# Patient Record
Sex: Female | Born: 1965 | Race: White | Hispanic: No | Marital: Single | Smoking: Never smoker
Health system: Southern US, Community
[De-identification: ages and names within clinical notes are randomized; demographics above are authoritative.]

## PROBLEM LIST (undated history)

## (undated) DIAGNOSIS — E7800 Pure hypercholesterolemia, unspecified: Secondary | ICD-10-CM

## (undated) DIAGNOSIS — M2550 Pain in unspecified joint: Secondary | ICD-10-CM

## (undated) DIAGNOSIS — R5383 Other fatigue: Secondary | ICD-10-CM

## (undated) DIAGNOSIS — T50995A Adverse effect of other drugs, medicaments and biological substances, initial encounter: Secondary | ICD-10-CM

## (undated) DIAGNOSIS — M79643 Pain in unspecified hand: Secondary | ICD-10-CM

## (undated) DIAGNOSIS — L709 Acne, unspecified: Secondary | ICD-10-CM

## (undated) DIAGNOSIS — E785 Hyperlipidemia, unspecified: Secondary | ICD-10-CM

## (undated) DIAGNOSIS — H60339 Swimmer's ear, unspecified ear: Secondary | ICD-10-CM

## (undated) DIAGNOSIS — Z800 Family history of malignant neoplasm of digestive organs: Secondary | ICD-10-CM

## (undated) DIAGNOSIS — N950 Postmenopausal bleeding: Secondary | ICD-10-CM

## (undated) DIAGNOSIS — R7303 Prediabetes: Secondary | ICD-10-CM

## (undated) DIAGNOSIS — R319 Hematuria, unspecified: Secondary | ICD-10-CM

## (undated) DIAGNOSIS — Z86018 Personal history of other benign neoplasm: Secondary | ICD-10-CM

## (undated) DIAGNOSIS — M7060 Trochanteric bursitis, unspecified hip: Secondary | ICD-10-CM

## (undated) DIAGNOSIS — Z8742 Personal history of other diseases of the female genital tract: Secondary | ICD-10-CM

## (undated) DIAGNOSIS — N809 Endometriosis, unspecified: Secondary | ICD-10-CM

## (undated) DIAGNOSIS — Z8249 Family history of ischemic heart disease and other diseases of the circulatory system: Secondary | ICD-10-CM

## (undated) DIAGNOSIS — D509 Iron deficiency anemia, unspecified: Secondary | ICD-10-CM

## (undated) DIAGNOSIS — E781 Pure hyperglyceridemia: Secondary | ICD-10-CM

## (undated) DIAGNOSIS — E119 Type 2 diabetes mellitus without complications: Secondary | ICD-10-CM

## (undated) DIAGNOSIS — E669 Obesity, unspecified: Secondary | ICD-10-CM

## (undated) DIAGNOSIS — D219 Benign neoplasm of connective and other soft tissue, unspecified: Secondary | ICD-10-CM

## (undated) DIAGNOSIS — I10 Essential (primary) hypertension: Secondary | ICD-10-CM

## (undated) DIAGNOSIS — L732 Hidradenitis suppurativa: Secondary | ICD-10-CM

## (undated) DIAGNOSIS — G4726 Circadian rhythm sleep disorder, shift work type: Secondary | ICD-10-CM

## (undated) HISTORY — DX: Pain in unspecified hand: M79.643

## (undated) HISTORY — DX: Prediabetes: R73.03

## (undated) HISTORY — DX: Hidradenitis suppurativa: L73.2

## (undated) HISTORY — DX: Swimmer's ear, unspecified ear: H60.339

## (undated) HISTORY — DX: Pain in unspecified joint: M25.50

## (undated) HISTORY — DX: Family history of ischemic heart disease and other diseases of the circulatory system: Z82.49

## (undated) HISTORY — DX: Essential (primary) hypertension: I10

## (undated) HISTORY — DX: Benign neoplasm of connective and other soft tissue, unspecified: D21.9

## (undated) HISTORY — DX: Trochanteric bursitis, unspecified hip: M70.60

## (undated) HISTORY — DX: Other fatigue: R53.83

## (undated) HISTORY — PX: LEEP: SHX91

## (undated) HISTORY — DX: Type 2 diabetes mellitus without complications: E11.9

## (undated) HISTORY — DX: Family history of malignant neoplasm of digestive organs: Z80.0

## (undated) HISTORY — DX: Obesity, unspecified: E66.9

## (undated) HISTORY — PX: CERVICAL BIOPSY  W/ LOOP ELECTRODE EXCISION: SUR135

## (undated) HISTORY — DX: Acne, unspecified: L70.9

## (undated) HISTORY — DX: Adverse effect of other drugs, medicaments and biological substances, initial encounter: T50.995A

## (undated) HISTORY — DX: Pure hyperglyceridemia: E78.1

## (undated) HISTORY — DX: Personal history of other diseases of the female genital tract: Z87.42

## (undated) HISTORY — DX: Hyperlipidemia, unspecified: E78.5

## (undated) HISTORY — DX: Hematuria, unspecified: R31.9

## (undated) HISTORY — PX: CARPAL TUNNEL RELEASE: SHX101

## (undated) HISTORY — PX: OTHER SURGICAL HISTORY: SHX169

## (undated) HISTORY — DX: Pure hypercholesterolemia, unspecified: E78.00

## (undated) HISTORY — DX: Circadian rhythm sleep disorder, shift work type: G47.26

## (undated) HISTORY — DX: Iron deficiency anemia, unspecified: D50.9

---

## 1985-11-15 HISTORY — PX: LASER LAPAROSCOPY: SHX1952

## 1999-02-17 ENCOUNTER — Other Ambulatory Visit: Admission: RE | Admit: 1999-02-17 | Discharge: 1999-02-17 | Payer: Self-pay | Admitting: Obstetrics and Gynecology

## 2001-01-13 ENCOUNTER — Ambulatory Visit (HOSPITAL_BASED_OUTPATIENT_CLINIC_OR_DEPARTMENT_OTHER): Admission: RE | Admit: 2001-01-13 | Discharge: 2001-01-13 | Payer: Self-pay | Admitting: Surgery

## 2001-01-13 ENCOUNTER — Encounter (INDEPENDENT_AMBULATORY_CARE_PROVIDER_SITE_OTHER): Payer: Self-pay | Admitting: *Deleted

## 2002-05-01 ENCOUNTER — Other Ambulatory Visit: Admission: RE | Admit: 2002-05-01 | Discharge: 2002-05-01 | Payer: Self-pay | Admitting: Gynecology

## 2002-09-21 ENCOUNTER — Ambulatory Visit (HOSPITAL_COMMUNITY): Admission: RE | Admit: 2002-09-21 | Discharge: 2002-09-21 | Payer: Self-pay | Admitting: *Deleted

## 2002-12-11 ENCOUNTER — Ambulatory Visit (HOSPITAL_BASED_OUTPATIENT_CLINIC_OR_DEPARTMENT_OTHER): Admission: RE | Admit: 2002-12-11 | Discharge: 2002-12-11 | Payer: Self-pay | Admitting: Orthopedic Surgery

## 2003-02-26 ENCOUNTER — Other Ambulatory Visit: Admission: RE | Admit: 2003-02-26 | Discharge: 2003-02-26 | Payer: Self-pay | Admitting: Gynecology

## 2003-08-06 ENCOUNTER — Encounter: Admission: RE | Admit: 2003-08-06 | Discharge: 2003-08-06 | Payer: Self-pay | Admitting: Gynecology

## 2003-08-06 ENCOUNTER — Encounter: Payer: Self-pay | Admitting: Gynecology

## 2004-12-16 ENCOUNTER — Encounter (INDEPENDENT_AMBULATORY_CARE_PROVIDER_SITE_OTHER): Payer: Self-pay | Admitting: Internal Medicine

## 2004-12-16 LAB — CONVERTED CEMR LAB: Pap Smear: NORMAL

## 2005-03-31 ENCOUNTER — Ambulatory Visit: Payer: Self-pay | Admitting: Internal Medicine

## 2005-04-02 ENCOUNTER — Ambulatory Visit: Payer: Self-pay | Admitting: *Deleted

## 2005-05-01 ENCOUNTER — Emergency Department (HOSPITAL_COMMUNITY): Admission: EM | Admit: 2005-05-01 | Discharge: 2005-05-01 | Payer: Self-pay | Admitting: Emergency Medicine

## 2005-08-03 ENCOUNTER — Ambulatory Visit: Payer: Self-pay | Admitting: Nurse Practitioner

## 2005-08-12 ENCOUNTER — Ambulatory Visit (HOSPITAL_COMMUNITY): Admission: RE | Admit: 2005-08-12 | Discharge: 2005-08-12 | Payer: Self-pay | Admitting: Internal Medicine

## 2005-08-17 ENCOUNTER — Ambulatory Visit: Payer: Self-pay | Admitting: Internal Medicine

## 2005-08-25 ENCOUNTER — Ambulatory Visit: Payer: Self-pay | Admitting: Internal Medicine

## 2005-09-21 ENCOUNTER — Ambulatory Visit: Payer: Self-pay | Admitting: Internal Medicine

## 2005-11-04 ENCOUNTER — Ambulatory Visit: Payer: Self-pay | Admitting: Family Medicine

## 2006-01-11 ENCOUNTER — Ambulatory Visit (HOSPITAL_COMMUNITY): Admission: RE | Admit: 2006-01-11 | Discharge: 2006-01-11 | Payer: Self-pay | Admitting: *Deleted

## 2006-01-12 ENCOUNTER — Ambulatory Visit: Payer: Self-pay | Admitting: Internal Medicine

## 2006-01-19 ENCOUNTER — Ambulatory Visit: Payer: Self-pay | Admitting: Internal Medicine

## 2006-01-21 ENCOUNTER — Ambulatory Visit (HOSPITAL_COMMUNITY): Admission: RE | Admit: 2006-01-21 | Discharge: 2006-01-21 | Payer: Self-pay | Admitting: Internal Medicine

## 2006-01-21 ENCOUNTER — Ambulatory Visit: Payer: Self-pay | Admitting: Internal Medicine

## 2006-02-23 ENCOUNTER — Ambulatory Visit: Payer: Self-pay | Admitting: Internal Medicine

## 2006-03-16 ENCOUNTER — Ambulatory Visit: Payer: Self-pay | Admitting: Obstetrics and Gynecology

## 2006-06-29 ENCOUNTER — Other Ambulatory Visit: Admission: RE | Admit: 2006-06-29 | Discharge: 2006-06-29 | Payer: Self-pay | Admitting: Gynecology

## 2006-09-29 ENCOUNTER — Other Ambulatory Visit: Admission: RE | Admit: 2006-09-29 | Discharge: 2006-09-29 | Payer: Self-pay | Admitting: Gynecology

## 2006-11-16 ENCOUNTER — Ambulatory Visit: Payer: Self-pay | Admitting: Family Medicine

## 2006-12-13 IMAGING — CR DG ESOPHAGUS
1 series · 1 of 1 positions shown · non-contrast
Comparison: none

CLINICAL DATA: Dysphagia, history of   gastroesophageal reflux disease.  Esophageal study.  
 ESOPHAGRAM:
 With the aide of fluoroscopic visualization, barium was seen to pass freely through the esophagus revealing a small sliding hiatal hernia with a Schatzki?s ring.  There was no gastroesophageal reflux or obstruction to the passage of a 3 x 13 mm barium tablet at the time of this study.  There appeared to be no significant delay in gastric emptying.

[view not recorded]
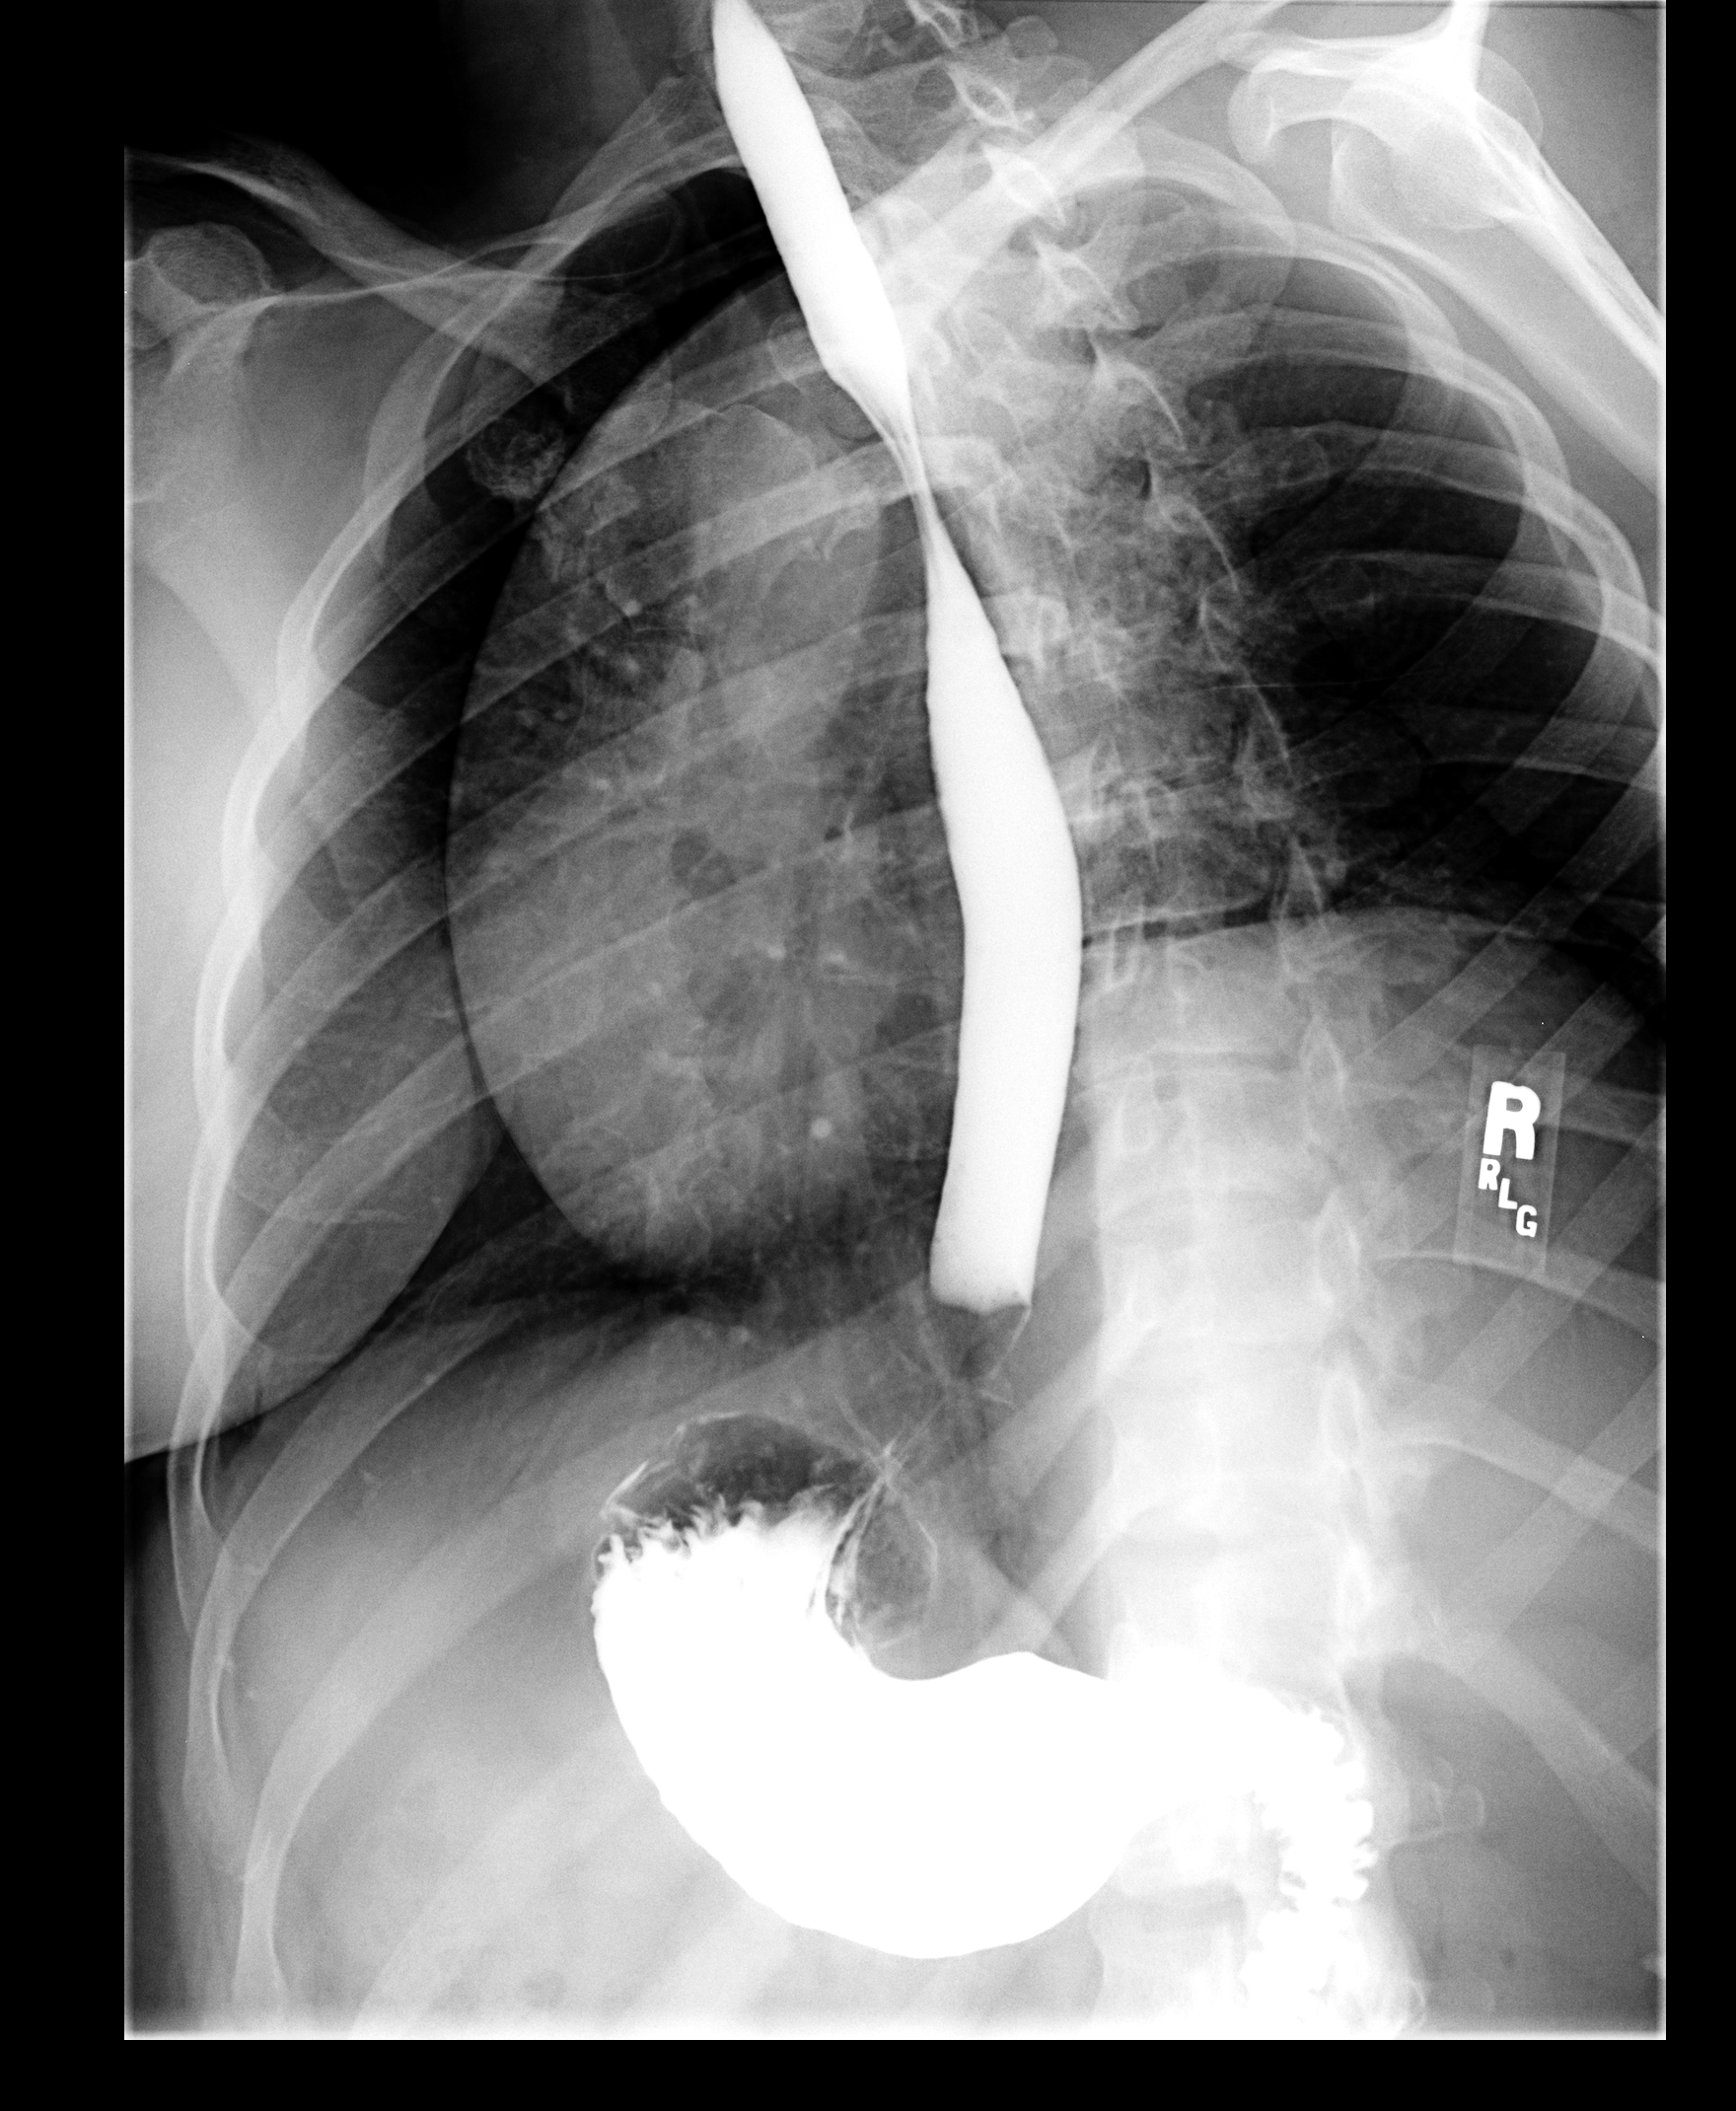

[1 of 1 positions shown; findings below may reference images not displayed]

IMPRESSION: Small sliding hiatal hernia without reflux or obstruction.

## 2007-01-12 ENCOUNTER — Ambulatory Visit (HOSPITAL_COMMUNITY): Admission: RE | Admit: 2007-01-12 | Discharge: 2007-01-12 | Payer: Self-pay | Admitting: Obstetrics & Gynecology

## 2007-04-28 ENCOUNTER — Other Ambulatory Visit: Admission: RE | Admit: 2007-04-28 | Discharge: 2007-04-28 | Payer: Self-pay | Admitting: Gynecology

## 2007-06-28 ENCOUNTER — Encounter (INDEPENDENT_AMBULATORY_CARE_PROVIDER_SITE_OTHER): Payer: Self-pay | Admitting: Internal Medicine

## 2007-06-28 DIAGNOSIS — G5600 Carpal tunnel syndrome, unspecified upper limb: Secondary | ICD-10-CM

## 2007-06-28 DIAGNOSIS — K219 Gastro-esophageal reflux disease without esophagitis: Secondary | ICD-10-CM

## 2007-08-02 ENCOUNTER — Encounter (INDEPENDENT_AMBULATORY_CARE_PROVIDER_SITE_OTHER): Payer: Self-pay | Admitting: *Deleted

## 2008-01-12 ENCOUNTER — Ambulatory Visit (HOSPITAL_COMMUNITY): Admission: RE | Admit: 2008-01-12 | Discharge: 2008-01-12 | Payer: Self-pay | Admitting: Gynecology

## 2008-01-23 ENCOUNTER — Emergency Department (HOSPITAL_COMMUNITY): Admission: EM | Admit: 2008-01-23 | Discharge: 2008-01-23 | Payer: Self-pay | Admitting: Emergency Medicine

## 2008-03-04 ENCOUNTER — Ambulatory Visit: Payer: Self-pay | Admitting: Internal Medicine

## 2008-03-04 ENCOUNTER — Encounter: Payer: Self-pay | Admitting: Internal Medicine

## 2008-03-04 LAB — CONVERTED CEMR LAB
ALT: 9 units/L (ref 0–35)
AST: 11 units/L (ref 0–37)
Alkaline Phosphatase: 71 units/L (ref 39–117)
Basophils Absolute: 0.1 10*3/uL (ref 0.0–0.1)
Calcium: 8.9 mg/dL (ref 8.4–10.5)
Chloride: 107 meq/L (ref 96–112)
HCT: 40.9 % (ref 36.0–46.0)
HDL: 31 mg/dL — ABNORMAL LOW (ref >39)
LDL Cholesterol: 111 mg/dL — ABNORMAL HIGH (ref 0–99)
Lymphs Abs: 2.7 10*3/uL (ref 0.7–4.0)
Neutro Abs: 5.3 10*3/uL (ref 1.7–7.7)
Neutrophils Relative %: 57 % (ref 43–77)
Potassium: 4.4 meq/L (ref 3.5–5.3)
RDW: 14.5 % (ref 11.5–15.5)
Total Bilirubin: 0.3 mg/dL (ref 0.3–1.2)
Total Protein: 7.3 g/dL (ref 6.0–8.3)
Triglycerides: 301 mg/dL — ABNORMAL HIGH (ref <150)
VLDL: 60 mg/dL — ABNORMAL HIGH (ref 0–40)

## 2008-03-22 ENCOUNTER — Other Ambulatory Visit: Admission: RE | Admit: 2008-03-22 | Discharge: 2008-03-22 | Payer: Self-pay | Admitting: Gynecology

## 2008-04-15 ENCOUNTER — Ambulatory Visit: Payer: Self-pay | Admitting: Internal Medicine

## 2008-04-30 ENCOUNTER — Ambulatory Visit: Payer: Self-pay | Admitting: Internal Medicine

## 2008-08-22 ENCOUNTER — Ambulatory Visit: Payer: Self-pay | Admitting: Internal Medicine

## 2008-10-29 ENCOUNTER — Ambulatory Visit: Payer: Self-pay | Admitting: Internal Medicine

## 2009-03-26 ENCOUNTER — Ambulatory Visit: Payer: Self-pay | Admitting: Family Medicine

## 2009-04-28 ENCOUNTER — Ambulatory Visit: Payer: Self-pay | Admitting: Family Medicine

## 2009-07-23 ENCOUNTER — Encounter: Payer: Self-pay | Admitting: Internal Medicine

## 2009-07-23 ENCOUNTER — Encounter (INDEPENDENT_AMBULATORY_CARE_PROVIDER_SITE_OTHER): Payer: Self-pay | Admitting: *Deleted

## 2009-07-23 ENCOUNTER — Ambulatory Visit: Payer: Self-pay | Admitting: Internal Medicine

## 2009-07-23 LAB — CONVERTED CEMR LAB
Basophils Absolute: 0.1 10*3/uL (ref 0.0–0.1)
Eosinophils Relative: 3 % (ref 0–5)
GC Probe Amp, Genital: NEGATIVE
HCT: 38.4 % (ref 36.0–46.0)
Lymphocytes Relative: 26 % (ref 12–46)
Lymphs Abs: 2.5 10*3/uL (ref 0.7–4.0)
MCHC: 31 g/dL (ref 30.0–36.0)
Monocytes Absolute: 0.7 10*3/uL (ref 0.1–1.0)
Monocytes Relative: 8 % (ref 3–12)
Platelets: 368 10*3/uL (ref 150–400)
RBC: 4.83 M/uL (ref 3.87–5.11)
WBC: 9.8 10*3/uL (ref 4.0–10.5)

## 2009-07-30 ENCOUNTER — Ambulatory Visit (HOSPITAL_COMMUNITY): Admission: RE | Admit: 2009-07-30 | Discharge: 2009-07-30 | Payer: Self-pay | Admitting: Internal Medicine

## 2009-08-20 ENCOUNTER — Ambulatory Visit: Payer: Self-pay | Admitting: Internal Medicine

## 2009-08-20 ENCOUNTER — Ambulatory Visit (HOSPITAL_COMMUNITY): Admission: RE | Admit: 2009-08-20 | Discharge: 2009-08-20 | Payer: Self-pay | Admitting: Internal Medicine

## 2009-10-07 ENCOUNTER — Ambulatory Visit: Payer: Self-pay | Admitting: Internal Medicine

## 2009-10-08 ENCOUNTER — Encounter (INDEPENDENT_AMBULATORY_CARE_PROVIDER_SITE_OTHER): Payer: Self-pay | Admitting: Family Medicine

## 2009-11-06 ENCOUNTER — Ambulatory Visit: Payer: Self-pay | Admitting: Obstetrics and Gynecology

## 2010-07-24 ENCOUNTER — Emergency Department (HOSPITAL_COMMUNITY): Admission: EM | Admit: 2010-07-24 | Discharge: 2010-07-24 | Payer: Self-pay | Admitting: Emergency Medicine

## 2010-11-06 ENCOUNTER — Emergency Department (HOSPITAL_COMMUNITY)
Admission: EM | Admit: 2010-11-06 | Discharge: 2010-11-06 | Payer: Self-pay | Source: Home / Self Care | Admitting: Family Medicine

## 2010-11-10 ENCOUNTER — Emergency Department (HOSPITAL_COMMUNITY)
Admission: EM | Admit: 2010-11-10 | Discharge: 2010-11-10 | Payer: Self-pay | Source: Home / Self Care | Admitting: Emergency Medicine

## 2010-11-30 ENCOUNTER — Emergency Department (HOSPITAL_COMMUNITY)
Admission: EM | Admit: 2010-11-30 | Discharge: 2010-11-30 | Payer: Self-pay | Source: Home / Self Care | Admitting: Emergency Medicine

## 2010-12-07 ENCOUNTER — Encounter: Payer: Self-pay | Admitting: Internal Medicine

## 2011-02-15 LAB — POCT URINALYSIS DIP (DEVICE)
Bilirubin Urine: NEGATIVE
Nitrite: NEGATIVE
Protein, ur: NEGATIVE mg/dL
Specific Gravity, Urine: 1.025 (ref 1.005–1.030)
pH: 5 (ref 5.0–8.0)

## 2011-04-02 NOTE — Group Therapy Note (Signed)
NAME:  Colleen Cantrell, Colleen Cantrell                ACCOUNT NO.:  [id number]   MEDICAL RECORD NO.:  [id number]          PATIENT TYPE:  WOC   LOCATION:  [facility]                   FACILITY:  [facility]   PHYSICIAN:  Argentina Donovan, MD        DATE OF BIRTH:  [birth year]   DATE OF SERVICE:                                    CLINIC NOTE   HISTORY OF PRESENT ILLNESS:  The patient is a 44 year old nulligravida white  female who had been referred by [facility] because of an unsatisfactory  colposcopy after an abnormal Pap smear showing SIL.  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  However, the operator could not see the transition zone in its entirety  which makes the colposcopy unsatisfactory.  The patient had a previous LEEP  in 1992 for severe dysplasia, and has had normal Pap smears since that time  up until now.  I have discussed with the patient and told her that if it was  a satisfactory colposcopy we would probably follow her up with Pap smears in  6 months.  Because it is unsatisfactory and with her history, we are going  to repeat her LEEP procedure.  She is concerned a little bit about scarring  and her ability to have children since she has none.  We have told her that  she is certainly at risk as well as pre-term labor following a procedure so  she would have to be checked very frequently once and if she ever does get  pregnant.  She and her mother have watched the film on LEEP, and we are  scheduling her for that procedure in the future.           ______________________________  Argentina Donovan, MD     PR/MEDQ  D:  03/16/2006  T:  03/16/2006  Job:  [id number]

## 2011-04-02 NOTE — Op Note (Signed)
   NAME:  Colleen Cantrell, Colleen Cantrell                          ACCOUNT NO.:  [id number]   MEDICAL RECORD NO.:  [id number]                   PATIENT TYPE:  AMB   LOCATION:  ENDO                                 FACILITY:  [facility]   PHYSICIAN:  Georgiana Spinner, M.D.                 DATE OF BIRTH:  09/18/1966   DATE OF PROCEDURE:  DATE OF DISCHARGE:                                 OPERATIVE REPORT   PROCEDURE:  Upper endoscopy.   INDICATIONS FOR PROCEDURE:  Dysphagia.   ANESTHESIA:  Demerol 70, Versed 10 mg.   DESCRIPTION OF PROCEDURE:  With the patient mildly sedated in the left  lateral decubitus position, the Olympus videoscopic endoscope was inserted  in the mouth and passed under direct vision through the esophagus then into  the stomach. There was some submucosal hemorrhage seen in the stomach. We  entered into the duodenal bulb and ulcerations and erosions were seen and  photographed.  From this point, we advanced to the second portion of the  duodenum which appeared normal. The endoscope was slowly withdrawn taking  circumferential views of the duodenal mucosa until the endoscope was then  pulled back in the stomach, placed in retroflexion to view the stomach from  below and a loose wrap above the GE junction around the endoscope was seen  and photographed. The endoscope was then straightened and biopsied for  Helicobacter which was taken from the gastric antrum. Subsequently, a  guidewire was passed and a Savary 14 dilator was passed easily over the  guidewire. However, with the 17 there was some resistance so I elected to  pull the guidewire, reinserted the endoscope over which the guidewire was  then advanced through the endoscope was withdrawn taking circumferential  views of the remaining gastric and esophageal mucosa. A 17 Savary dilator  was passed over this guidewire and with it the guidewire was removed. The  endoscope was reinserted, some blood was seen in the stomach to be expected  but no other lesions noted and no active bleeding noted. The endoscope was  withdrawn. The patient's vital signs and pulse oximeter remained stable. The  patient tolerated the procedure well without apparent complications.   FINDINGS:  Esophageal dilation to 14 and 17 Savary, ulcerations of the  duodenal bulb and antrum. Await biopsy report for Helicobacter. The patient  will call me for results, continue on PPI therapy and followup with me as an  outpatient.                                                Georgiana Spinner, M.D.    GMO/MEDQ  D:  09/21/2002  T:  09/21/2002  Job:  [id number]

## 2011-04-02 NOTE — Op Note (Signed)
   NAME:  Colleen Cantrell, Colleen Cantrell                          ACCOUNT NO.:  [id number]   MEDICAL RECORD NO.:  [id number]                   PATIENT TYPE:  AMB   LOCATION:  [facility]                                  FACILITY:  [facility]   PHYSICIAN:  Katy Fitch. Naaman Plummer., M.D.          DATE OF BIRTH:  1966-08-09   DATE OF PROCEDURE:  12/11/2002  DATE OF DISCHARGE:                                 OPERATIVE REPORT   PREOPERATIVE DIAGNOSIS:  Entrapment neuropathy, median nerve, left carpal  tunnel.   POSTOPERATIVE DIAGNOSIS:  Entrapment neuropathy, median nerve, left carpal  tunnel.   PROCEDURE:  Release of left transverse carpal ligament.   SURGEON:  Katy Fitch. Sypher, M.D.   ASSISTANT:  Jonni Sanger, P.A.   ANESTHESIA:  General by LMA, supervising anesthesiologist is Maren Beach, M.D.   INDICATIONS FOR PROCEDURE:  The patient is a 45 year old woman was referred  for evaluation and management of bilateral hand numbness and discomfort.  Clinical examination revealed bilateral carpal tunnel syndrome.  Elective  diagnostic studies confirmed significant bilateral carpal tunnel syndrome.   Due to a failure to respond to nonoperative measures, she is brought to the  operating room at this time for release of her left transverse carpal  ligament.   DESCRIPTION OF PROCEDURE:  The patient is brought to the operating room and  placed in the supine position on the operating table.  Following induction  of general anesthesia, the left arm was prepped with Betadine soap and  solution and sterilely draped.  Following exsanguination of the limb with  Esmarch bandage, the tourniquet was inflated to 220 mmHg.  The procedure  commenced with a short incision in the line of the ring finger and the palm.  Subcutaneous tissues were carefully divided in the middle palmar fascia.  This was split longitudinally to the common sensory branch of the median  nerve.  These were followed by to the transverse carpal  ligament which was  carefully isolated by the median nerve. The ligament was released along its  ulnar border into the distal forearm.  This widely opened the carpal canal.  Bleeding points along the margin of the released ligament was  electrocauterized by current followed by repair of the skin with intradermal  3-0 Prolene suture.   Compressive dressing was applied with a volar plaster splint maintaining the  wrist in 5 degrees of dorsiflexion.                                               Katy Fitch Naaman Plummer., M.D.    RVS/MEDQ  D:  12/11/2002  T:  12/11/2002  Job:  [id number]

## 2012-03-10 ENCOUNTER — Other Ambulatory Visit (HOSPITAL_COMMUNITY): Payer: Self-pay | Admitting: Family Medicine

## 2012-03-10 ENCOUNTER — Other Ambulatory Visit: Payer: Self-pay

## 2012-03-10 DIAGNOSIS — Z1231 Encounter for screening mammogram for malignant neoplasm of breast: Secondary | ICD-10-CM

## 2012-04-06 ENCOUNTER — Ambulatory Visit (HOSPITAL_COMMUNITY)
Admission: RE | Admit: 2012-04-06 | Discharge: 2012-04-06 | Disposition: A | Discharge status: Home / Self Care | Payer: Self-pay | Source: Ambulatory Visit | Attending: Family Medicine | Admitting: Family Medicine

## 2012-04-06 DIAGNOSIS — Z1231 Encounter for screening mammogram for malignant neoplasm of breast: Secondary | ICD-10-CM | POA: Insufficient documentation

## 2012-06-23 ENCOUNTER — Encounter: Payer: Self-pay | Admitting: *Deleted

## 2012-06-23 NOTE — Progress Notes (Signed)
Patient ID: Colleen Cantrell, female   DOB: 19-Jun-1966, 46 y.o.   MRN: [id number] Pt called requesting our office to request [facility]. [facility] will be closing soon. Left message on pt voicemail that she can fill out [facility]F and they can send record to [place].

## 2012-11-27 ENCOUNTER — Emergency Department (HOSPITAL_COMMUNITY)
Admission: EM | Admit: 2012-11-27 | Discharge: 2012-11-27 | Disposition: A | Discharge status: Home / Self Care | Payer: Self-pay

## 2012-11-28 ENCOUNTER — Emergency Department (HOSPITAL_BASED_OUTPATIENT_CLINIC_OR_DEPARTMENT_OTHER)
Admission: EM | Admit: 2012-11-28 | Discharge: 2012-11-28 | Disposition: A | Discharge status: Home / Self Care | Payer: No Typology Code available for payment source | Attending: Emergency Medicine | Admitting: Emergency Medicine

## 2012-11-28 ENCOUNTER — Encounter (HOSPITAL_BASED_OUTPATIENT_CLINIC_OR_DEPARTMENT_OTHER): Payer: Self-pay | Admitting: Emergency Medicine

## 2012-11-28 DIAGNOSIS — R350 Frequency of micturition: Secondary | ICD-10-CM | POA: Insufficient documentation

## 2012-11-28 DIAGNOSIS — Z8742 Personal history of other diseases of the female genital tract: Secondary | ICD-10-CM | POA: Insufficient documentation

## 2012-11-28 DIAGNOSIS — H6120 Impacted cerumen, unspecified ear: Secondary | ICD-10-CM | POA: Insufficient documentation

## 2012-11-28 DIAGNOSIS — R319 Hematuria, unspecified: Secondary | ICD-10-CM | POA: Insufficient documentation

## 2012-11-28 DIAGNOSIS — Z3202 Encounter for pregnancy test, result negative: Secondary | ICD-10-CM | POA: Insufficient documentation

## 2012-11-28 HISTORY — DX: Personal history of other benign neoplasm: Z86.018

## 2012-11-28 HISTORY — DX: Endometriosis, unspecified: N80.9

## 2012-11-28 LAB — URINE MICROSCOPIC-ADD ON

## 2012-11-28 LAB — URINALYSIS, ROUTINE W REFLEX MICROSCOPIC
Glucose, UA: NEGATIVE mg/dL
Leukocytes, UA: NEGATIVE
Urobilinogen, UA: 0.2 mg/dL (ref 0.0–1.0)

## 2012-11-28 NOTE — ED Provider Notes (Signed)
History     CSN: [id number]  Arrival date & time 11/28/12  0630   First MD Initiated Contact with Patient 11/28/12 [phone]      Chief Complaint  Patient presents with  . Ear Fullness  . Urinary Frequency    (Consider location/radiation/quality/duration/timing/severity/associated sxs/prior treatment) HPI Comments: Pt presents with cerumen impaction to her left ear.  She says that it has been there for about one month.  Has some minor discomfort to left ear, but denies pain.  No fevers.  No hearing loss.  No drainage.  Has used OTC ear wax removal kit without relief.  Also has some urinary frequency.  No abdominal pain.  Some mild back pain.  No n/v/d.  No vaginal bleeding or discharge.   Past Medical History  Diagnosis Date  . History of uterine fibroid   . Endometriosis     Past Surgical History  Procedure Date  . Carpal tunnel release     No family history on file.  History  Substance Use Topics  . Smoking status: Never Smoker   . Smokeless tobacco: Not on file  . Alcohol Use: No    OB History    Grav Para Term Preterm Abortions TAB SAB Ect Mult Living                  Review of Systems  Constitutional: Negative for fever, chills, diaphoresis and fatigue.  HENT: Negative for hearing loss, ear pain, congestion, rhinorrhea, sneezing and tinnitus.   Eyes: Negative.   Respiratory: Negative for cough, chest tightness and shortness of breath.   Cardiovascular: Negative for chest pain and leg swelling.  Gastrointestinal: Negative for nausea, vomiting, abdominal pain, diarrhea and blood in stool.  Genitourinary: Positive for frequency. Negative for hematuria, flank pain and difficulty urinating.  Musculoskeletal: Negative for back pain and arthralgias.  Skin: Negative for rash.  Neurological: Negative for dizziness, speech difficulty, weakness, numbness and headaches.    Allergies  Sulfonamide derivatives  Home Medications  No current outpatient prescriptions on  file.  BP 149/74  Pulse 86  Temp 98 F (36.7 C) (Oral)  Resp 18  Ht 5\' 1"  (1.549 m)  Wt 170 lb (77.111 kg)  BMI 32.12 kg/m2  SpO2 99%  Physical Exam  Constitutional: She is oriented to person, place, and time. She appears well-developed and well-nourished.  HENT:  Head: Normocephalic and atraumatic.  Mouth/Throat: Oropharynx is clear and moist.       The right ear has mild amount of wax buildup. The left ear has a large amount of wax in the canal with a cerumen impaction.  Eyes: Pupils are equal, round, and reactive to light.  Neck: Normal range of motion. Neck supple.  Cardiovascular: Normal rate, regular rhythm and normal heart sounds.   Pulmonary/Chest: Effort normal and breath sounds normal. No respiratory distress. She has no wheezes. She has no rales. She exhibits no tenderness.  Abdominal: Soft. Bowel sounds are normal. There is no tenderness. There is no rebound and no guarding.  Musculoskeletal: Normal range of motion. She exhibits no edema.  Lymphadenopathy:    She has no cervical adenopathy.  Neurological: She is alert and oriented to person, place, and time.  Skin: Skin is warm and dry. No rash noted.  Psychiatric: She has a normal mood and affect.    ED Course  Procedures (including critical care time)  Results for orders placed during the hospital encounter of 11/28/12  URINALYSIS, ROUTINE W REFLEX MICROSCOPIC  Component Value Range   Color, Urine YELLOW  YELLOW   APPearance CLEAR  CLEAR   Specific Gravity, Urine 1.021  1.005 - 1.030   pH 5.0  5.0 - 8.0   Glucose, UA NEGATIVE  NEGATIVE mg/dL   Hgb urine dipstick LARGE (*) NEGATIVE   Bilirubin Urine NEGATIVE  NEGATIVE   Ketones, ur NEGATIVE  NEGATIVE mg/dL   Protein, ur NEGATIVE  NEGATIVE mg/dL   Urobilinogen, UA 0.2  0.0 - 1.0 mg/dL   Nitrite NEGATIVE  NEGATIVE   Leukocytes, UA NEGATIVE  NEGATIVE  PREGNANCY, URINE      Component Value Range   Preg Test, Ur NEGATIVE  NEGATIVE  URINE MICROSCOPIC-ADD  ON      Component Value Range   Squamous Epithelial / LPF FEW (*) RARE   WBC, UA 0-2  <3 WBC/hpf   RBC / HPF 3-6  <3 RBC/hpf   Bacteria, UA FEW (*) RARE   No results found.    1. Cerumen impaction   2. Hematuria       MDM  Pt with left cerumen impaction that was removed with irrigation.  Ear reexamined and there is some mild erythema of canal, but no other signs of infection.  Urine sent for culture.  I did discuss importance of f/u to have urine rechecked regarding the hematuria.  Advised her to f/u with her ob/gyn or a urologist.        Rolan Bucco, MD 11/28/12 [phone]

## 2012-11-28 NOTE — ED Notes (Signed)
Pt also c/o urinary frequency. 

## 2012-11-28 NOTE — ED Notes (Signed)
Report received and care assumed.

## 2012-11-28 NOTE — ED Notes (Signed)
Pt c/o impaction and mild pain in left ear.

## 2012-11-29 LAB — URINE CULTURE: Colony Count: 15000

## 2013-02-14 ENCOUNTER — Encounter (HOSPITAL_BASED_OUTPATIENT_CLINIC_OR_DEPARTMENT_OTHER): Payer: Self-pay | Admitting: *Deleted

## 2013-02-14 ENCOUNTER — Emergency Department (HOSPITAL_BASED_OUTPATIENT_CLINIC_OR_DEPARTMENT_OTHER)
Admission: EM | Admit: 2013-02-14 | Discharge: 2013-02-14 | Disposition: A | Discharge status: Home / Self Care | Payer: No Typology Code available for payment source | Attending: Emergency Medicine | Admitting: Emergency Medicine

## 2013-02-14 DIAGNOSIS — Z8542 Personal history of malignant neoplasm of other parts of uterus: Secondary | ICD-10-CM | POA: Insufficient documentation

## 2013-02-14 DIAGNOSIS — Z8742 Personal history of other diseases of the female genital tract: Secondary | ICD-10-CM | POA: Insufficient documentation

## 2013-02-14 DIAGNOSIS — L732 Hidradenitis suppurativa: Secondary | ICD-10-CM | POA: Insufficient documentation

## 2013-02-14 MED ORDER — LIDOCAINE-EPINEPHRINE 2 %-1:100000 IJ SOLN
INTRAMUSCULAR | Status: AC
  Filled 2013-02-14: qty 1

## 2013-02-14 MED ORDER — CEPHALEXIN 500 MG PO CAPS: 500.0000 mg | ORAL_CAPSULE | Freq: Three times a day (TID) | ORAL | Status: DC

## 2013-02-14 MED ORDER — LIDOCAINE-EPINEPHRINE 2 %-1:100000 IJ SOLN
20.0000 mL | Freq: Once | INTRAMUSCULAR | Status: AC
  Administered 2013-02-14: 20 mL via INTRADERMAL

## 2013-02-14 NOTE — ED Provider Notes (Signed)
History     CSN: [id number]  Arrival date & time 02/14/13  1348   First MD Initiated Contact with Patient 02/14/13 1441      Chief Complaint  Patient presents with  . Abscess    (Consider location/radiation/quality/duration/timing/severity/associated sxs/prior treatment) HPI Patient is coming in with a boil in the left axilla.  It has been present for several weeks, but started draining today.  Had one in the same place several years ago.  It is painful.  No fevers or vomiting.   Past Medical History  Diagnosis Date  . History of uterine fibroid   . Endometriosis     Past Surgical History  Procedure Laterality Date  . Carpal tunnel release      History reviewed. No pertinent family history.  History  Substance Use Topics  . Smoking status: Never Smoker   . Smokeless tobacco: Not on file  . Alcohol Use: No    OB History   Grav Para Term Preterm Abortions TAB SAB Ect Mult Living                  Review of Systems  All other systems reviewed and are negative.    Allergies  Sulfonamide derivatives  Home Medications  No current outpatient prescriptions on file.  BP 144/75  Pulse 99  Temp(Src) 98.6 F (37 C) (Oral)  Resp 16  Ht 5\' 1"  (1.549 m)  Wt 178 lb (80.74 kg)  BMI 33.65 kg/m2  SpO2 98%  LMP 02/12/2013  Physical Exam  Constitutional: She appears well-developed and well-nourished. No distress.  Skin: She is not diaphoretic.  2x1cm area of flucutance in the left axilla with surround erythema.  Tender.  Draining pus from 3 small areas.    ED Course  INCISION AND DRAINAGE Date/Time: 02/14/2013 3:16 PM Performed by: Phebe Colla Authorized by: Nelia Shi Consent: Verbal consent obtained. written consent not obtained. Risks and benefits: risks, benefits and alternatives were discussed Consent given by: patient Patient understanding: patient states understanding of the procedure being performed Patient identity confirmed: verbally with  patient and arm band Type: abscess Location: Left axilla. Anesthesia: local infiltration Local anesthetic: lidocaine 2% with epinephrine Anesthetic total: 8 ml Patient sedated: no Scalpel size: 11 Incision type: single straight Complexity: simple Drainage: purulent Drainage amount: moderate Wound treatment: wound left open and drain placed Packing material: 1/4 in iodoform gauze Patient tolerance: Patient tolerated the procedure well with no immediate complications.   (including critical care time)  Labs Reviewed - No data to display No results found.   No diagnosis found.    MDM  47 yo F with hidradenitis.  Abscess draining spontaneously, but 1cm pocket of pus seen on bedside ultrasound.  Bedside I&D performed with drainage of coagulated pus.  Will give 10 days of Keflex.  Discussed definitive treatment is surgery and [place]'s information provided.  BOOTH, Thirza Pellicano 02/14/2013, 3:19 PM         Phebe Colla, MD 02/14/13 [phone]

## 2013-02-14 NOTE — ED Notes (Signed)
Pt c/o abscess to left axillary x 7 days

## 2013-02-14 NOTE — ED Notes (Signed)
4 x 4 dry dressing applied to left axilla.

## 2013-02-14 NOTE — ED Notes (Signed)
C/o "boil" under left axilla with green drainage x one week.

## 2013-02-16 NOTE — ED Provider Notes (Signed)
I saw and evaluated the patient, reviewed the resident's note and I agree with the findings and plan.   .Face to face Exam:  General:  Awake HEENT:  Atraumatic Resp:  Normal effort Abd:  Nondistended Neuro:No focal weakness   Nelia Shi, MD 02/16/13 1128

## 2013-06-18 DIAGNOSIS — Z01419 Encounter for gynecological examination (general) (routine) without abnormal findings: Secondary | ICD-10-CM | POA: Insufficient documentation

## 2013-06-19 ENCOUNTER — Other Ambulatory Visit: Payer: Self-pay

## 2013-07-18 ENCOUNTER — Other Ambulatory Visit (HOSPITAL_COMMUNITY)
Admission: RE | Admit: 2013-07-18 | Discharge: 2013-07-18 | Disposition: A | Discharge status: Home / Self Care | Payer: PRIVATE HEALTH INSURANCE | Source: Ambulatory Visit | Attending: Internal Medicine | Admitting: Internal Medicine

## 2014-04-15 HISTORY — PX: OTHER SURGICAL HISTORY: SHX169

## 2014-04-18 ENCOUNTER — Other Ambulatory Visit (HOSPITAL_COMMUNITY): Payer: Self-pay | Admitting: Internal Medicine

## 2014-04-18 DIAGNOSIS — Z1231 Encounter for screening mammogram for malignant neoplasm of breast: Secondary | ICD-10-CM

## 2014-04-23 ENCOUNTER — Ambulatory Visit (HOSPITAL_COMMUNITY)
Admission: RE | Admit: 2014-04-23 | Discharge: 2014-04-23 | Disposition: A | Discharge status: Home / Self Care | Payer: PRIVATE HEALTH INSURANCE | Source: Ambulatory Visit | Attending: Internal Medicine | Admitting: Internal Medicine

## 2014-04-23 DIAGNOSIS — Z1231 Encounter for screening mammogram for malignant neoplasm of breast: Secondary | ICD-10-CM | POA: Insufficient documentation

## 2014-04-24 ENCOUNTER — Other Ambulatory Visit: Payer: Self-pay | Admitting: Internal Medicine

## 2014-04-24 DIAGNOSIS — R928 Other abnormal and inconclusive findings on diagnostic imaging of breast: Secondary | ICD-10-CM

## 2014-05-07 ENCOUNTER — Other Ambulatory Visit: Payer: No Typology Code available for payment source

## 2014-05-14 ENCOUNTER — Other Ambulatory Visit: Payer: Self-pay | Admitting: Radiology

## 2014-06-06 ENCOUNTER — Encounter: Payer: Self-pay | Admitting: Gynecology

## 2014-06-06 ENCOUNTER — Ambulatory Visit (INDEPENDENT_AMBULATORY_CARE_PROVIDER_SITE_OTHER): Payer: PRIVATE HEALTH INSURANCE | Admitting: Gynecology

## 2014-06-06 ENCOUNTER — Other Ambulatory Visit (HOSPITAL_COMMUNITY)
Admission: RE | Admit: 2014-06-06 | Discharge: 2014-06-06 | Disposition: A | Discharge status: Home / Self Care | Payer: PRIVATE HEALTH INSURANCE | Source: Ambulatory Visit | Attending: Gynecology | Admitting: Gynecology

## 2014-06-06 VITALS — BP 116/72 | Ht 61.0 in | Wt 187.2 lb

## 2014-06-06 DIAGNOSIS — N926 Irregular menstruation, unspecified: Secondary | ICD-10-CM

## 2014-06-06 DIAGNOSIS — Z01419 Encounter for gynecological examination (general) (routine) without abnormal findings: Secondary | ICD-10-CM | POA: Insufficient documentation

## 2014-06-06 DIAGNOSIS — Z1151 Encounter for screening for human papillomavirus (HPV): Secondary | ICD-10-CM | POA: Insufficient documentation

## 2014-06-06 DIAGNOSIS — N898 Other specified noninflammatory disorders of vagina: Secondary | ICD-10-CM

## 2014-06-06 LAB — CBC WITH DIFFERENTIAL/PLATELET
BASOS PCT: 1 % (ref 0–1)
Basophils Absolute: 0.1 10*3/uL (ref 0.0–0.1)
EOS ABS: 0.3 10*3/uL (ref 0.0–0.7)
Eosinophils Relative: 4 % (ref 0–5)
HEMATOCRIT: 40.6 % (ref 36.0–46.0)
HEMOGLOBIN: 13.7 g/dL (ref 12.0–15.0)
LYMPHS ABS: 2.2 10*3/uL (ref 0.7–4.0)
Lymphocytes Relative: 26 % (ref 12–46)
MCH: 26.9 pg (ref 26.0–34.0)
MCHC: 33.7 g/dL (ref 30.0–36.0)
MCV: 79.8 fL (ref 78.0–100.0)
Monocytes Absolute: 0.5 10*3/uL (ref 0.1–1.0)
Monocytes Relative: 6 % (ref 3–12)
NEUTROS ABS: 5.2 10*3/uL (ref 1.7–7.7)
NEUTROS PCT: 63 % (ref 43–77)
Platelets: 309 10*3/uL (ref 150–400)
RBC: 5.09 MIL/uL (ref 3.87–5.11)
RDW: 15.5 % (ref 11.5–15.5)
WBC: 8.3 10*3/uL (ref 4.0–10.5)

## 2014-06-06 LAB — WET PREP FOR TRICH, YEAST, CLUE
Clue Cells Wet Prep HPF POC: NONE SEEN
Trich, Wet Prep: NONE SEEN

## 2014-06-06 LAB — TSH: TSH: 1.66 u[IU]/mL (ref 0.350–4.500)

## 2014-06-06 MED ORDER — FLUCONAZOLE 150 MG PO TABS: 150.0000 mg | ORAL_TABLET | Freq: Once | ORAL | Status: DC

## 2014-06-06 NOTE — Patient Instructions (Signed)
Followup for ultrasound as scheduled and lab results.

## 2014-06-06 NOTE — Addendum Note (Signed)
Addended by: Alen Blew on: 06/06/2014 05:14 PM   Modules accepted: Orders

## 2014-06-06 NOTE — Progress Notes (Signed)
Colleen Cantrell 1966/02/06 [id number]        48 y.o.  G0P0 for annual exam.  Has not been seen in 5 years. Several issues noted below.  Past medical history,surgical history, problem list, medications, allergies, family history and social history were all reviewed and documented as reviewed in the EPIC chart.  ROS:  12 system ROS performed with pertinent positives and negatives included in the history, assessment and plan.   Additional significant findings :  None   Exam: [name] Filed Vitals:   06/06/14 1150  BP: 116/72  Height: 5\' 1"  (1.549 m)  Weight: 187 lb 3.2 oz (84.913 kg)   General appearance:  Normal affect, orientation and appearance. Skin: Grossly normal HEENT: Without gross lesions.  No cervical or supraclavicular adenopathy. Thyroid normal.  Lungs:  Clear without wheezing, rales or rhonchi Cardiac: RR, without RMG Abdominal:  Soft, nontender, without masses, guarding, rebound, organomegaly or hernia Breasts:  Examined lying and sitting without masses, retractions, discharge or axillary adenopathy. Pelvic:  Ext/BUS/vagina with slight white discharge. Small pustules noted on both right and left labia  Cervix normal. Pap/HPV done  Uterus anteverted, normal size, shape and contour, midline and mobile nontender   Adnexa  Without masses or tenderness    Anus and perineum  Normal   Rectovaginal  Normal sphincter tone without palpated masses or tenderness.    Assessment/Plan:  47 y.o. G0P0 female for annual exam with irregular menses absents contraception..   1. Irregular menses. Patient notes in the last several years her periods have been irregular where she will skip up to 2 months. Sometimes heavier sometimes lighter. No prolonged episodes. Will check baseline labs to include CBC FSH prolactin TSH and baseline sonohysterogram. She does have a history of small leiomyoma excised a laparoscopy 1999 as well as endometriosis that was described and fulgurated without  biopsy. Various scenarios and options reviewed to include hormonal manipulation IUD possible surgery. 2. Small pustules on her labia. Patient notes intermittent pustules on the vulva and labia that come and go. No concerning changes. Recommend sitz baths and followup if they persist. 3. Vaginal discharge. Wet prep is positive for yeast. Will cover with Diflucan 150 mg x1 dose. 4. Contraception. Is not an issue now. Will readdress after workup for her irregular menses as treatment for this such as low-dose oral contraceptives or IUD would address. Patient does know if she becomes sexually active the need to use contraception. 5. Pap smear 2014. Pap/HPV today. 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 Followup Pap smears were negative. 6. Mammography 04/2014. Continue annual mammography. SBE monthly reviewed. 7. Colonoscopy 3 years ago. Repeat at their recommended interval. 8. Health maintenance. No routine blood work done as she recently reports this done through her primary physician's office. Followup for her lab results and sonohysterogram.   Note: This document was prepared with digital dictation and possible smart phrase technology. Any transcriptional errors that result from this process are unintentional.   Anastasio Auerbach MD, 12:38 PM 06/06/2014

## 2014-06-07 LAB — FOLLICLE STIMULATING HORMONE: FSH: 68.1 m[IU]/mL

## 2014-06-07 LAB — PROLACTIN: Prolactin: 8.2 ng/mL

## 2014-06-11 LAB — CYTOLOGY - PAP

## 2014-06-12 ENCOUNTER — Telehealth: Payer: Self-pay | Admitting: *Deleted

## 2014-06-12 NOTE — Telephone Encounter (Signed)
Pt informed with recent hemoglobin results on OV 06/06/14.

## 2014-07-05 ENCOUNTER — Other Ambulatory Visit: Payer: PRIVATE HEALTH INSURANCE

## 2014-07-05 ENCOUNTER — Ambulatory Visit: Payer: PRIVATE HEALTH INSURANCE | Admitting: Gynecology

## 2014-07-05 ENCOUNTER — Encounter: Payer: Self-pay | Admitting: Gynecology

## 2014-07-05 ENCOUNTER — Ambulatory Visit (INDEPENDENT_AMBULATORY_CARE_PROVIDER_SITE_OTHER): Payer: PRIVATE HEALTH INSURANCE | Admitting: Gynecology

## 2014-07-05 VITALS — BP 126/90 | HR 72 | Ht 61.0 in | Wt 190.0 lb

## 2014-07-05 DIAGNOSIS — R102 Pelvic and perineal pain: Secondary | ICD-10-CM

## 2014-07-05 DIAGNOSIS — N924 Excessive bleeding in the premenopausal period: Secondary | ICD-10-CM

## 2014-07-05 DIAGNOSIS — N949 Unspecified condition associated with female genital organs and menstrual cycle: Secondary | ICD-10-CM

## 2014-07-05 LAB — POCT URINALYSIS DIPSTICK
Bilirubin, UA: NEGATIVE
GLUCOSE UA: NEGATIVE
Ketones, UA: NEGATIVE
NITRITE UA: NEGATIVE
Protein, UA: NEGATIVE
Urobilinogen, UA: NEGATIVE
pH, UA: 5

## 2014-07-05 MED ORDER — DIAZEPAM 5 MG PO TABS: ORAL_TABLET | ORAL | Status: DC

## 2014-07-05 NOTE — Progress Notes (Signed)
48 y.o. divorced Caucasian female   G0P0 here for . Pt is not currently sexually active. Pt reports a few years of irregular cycles. Pt presents with cycles since 2014.  Pt had cycle 5/26-6/3, 6/27-710, 11/18-12/08/2013, 2/7-2/16/2015, 5/16-28/2015 and none since.  Pt states last year she had hot flashes and night sweats but seem to be improving.  Pt has a history of irregular cycles and flow 9-10d.  3d very heavy, last 3-4d spotting brown.  Pt was seen recently and evaluation started, TSH, PRL, CBC normal, FSH 68-high,    Patient's last menstrual period was 03/30/2014.          Sexually active: No.  The current method of family planning is none.    Exercising: Yes.     Last pap:06/06/14 Nl negative HRHPV. Abnormal PAP: yes, late 1980's LGSIL Alcohol:no Tobacco:no BSE:no Mammogram: 04/2014 BiRADS 0, bx 05/14/14-benign breast tissue   Health Maintenance  Topic Date Due  . Tetanus/tdap  11/15/2012  . Influenza Vaccine  06/15/2014  . Pap Smear  06/06/2017    Family History  Problem Relation Age of Onset  . Hypertension Mother   . Cancer Mother 25    Patient Active Problem List   Diagnosis Date Noted  . SYNDROME, CARPAL TUNNEL 06/28/2007  . GERD 06/28/2007    Past Medical History  Diagnosis Date  . History of uterine fibroid   . Endometriosis     Past Surgical History  Procedure Laterality Date  . Carpal tunnel release    . Esophagus stretching    . Breast biopsies  6/15    left     Allergies: Rifampin and Sulfonamide derivatives  Current Outpatient Prescriptions  Medication Sig Dispense Refill  . minocycline (DYNACIN) 50 MG tablet Take 50 mg by mouth 2 (two) times daily.      . traZODone (DESYREL) 50 MG tablet Take 50 mg by mouth at bedtime.      . fluconazole (DIFLUCAN) 150 MG tablet Take 150 mg by mouth daily.       No current facility-administered medications for this visit.    ROS: Pertinent items are noted in HPI.  Exam:    BP 126/90  Pulse 72  Ht 5\' 1"   (1.549 m)  Wt 190 lb (86.183 kg)  BMI 35.92 kg/m2  LMP 03/30/2014 Weight change: @WEIGHTCHANGE @ Last 3 height recordings:  Ht Readings from Last 3 Encounters:  07/05/14 5\' 1"  (1.549 m)  06/06/14 5\' 1"  (1.549 m)  02/14/13 5\' 1"  (1.549 m)   General appearance: alert, cooperative and appears stated age Head: Normocephalic, without obvious abnormality, atraumatic Lungs: clear to auscultation bilaterally Breasts: normal appearance, no masses or tenderness Heart: regular rate and rhythm, S1, S2 normal, no murmur, click, rub or gallop Abdomen: soft, non-tender; bowel sounds normal; no masses,  no organomegaly Extremities: extremities normal, atraumatic, no cyanosis or edema Skin: Skin color, texture, turgor normal. No rashes or lesions Lymph nodes: Cervical, supraclavicular, and axillary nodes normal. no inguinal nodes palpated Neurologic: Grossly normal   Pelvic: External genitalia:  no lesions              Urethra: normal appearing urethra with no masses, tenderness or lesions              Bartholins and Skenes: Bartholin's, Urethra, Skene's normal                 Vagina: normal appearing vagina with normal color and discharge, no lesions  Cervix: normal appearance              Pap taken: No.        Bimanual Exam:  Uterus:  uterus is normal size, shape, consistency and nontender                                      Adnexa:    normal adnexa in size, nontender and no masses                                      Rectovaginal: Deferred                                      Anus:  defer exam      1. Abnormal perimenopausal bleeding Partial evaluation done, reviewed lab findings, agree with other provider that [facility] and possible biopsy is indicated based on the severity of her bleeding.  Risks of endometrial abnormalities discussed.  Procedure outlined to pt, diagrams used.  Questions addressed.  Consent taken today and will have pt take valium before as muscle relaxer as well as  antiantiety-agreeable. - POCT urinalysis dipstick - diazepam (VALIUM) 5 MG tablet; Take one tablet 88m before procedure, bring bottle to office  Dispense: 2 tablet; Refill: 0 - [place] Sonohysterogram; Future - Endometrial biopsy; Future  2. Pelvic pain in female - diazepam (VALIUM) 5 MG tablet; Take one tablet 33m before procedure, bring bottle to office  Dispense: 2 tablet; Refill: 0  An After Visit Summary was printed and given to the patient.   71m spent reviewing records and counseling, >50% face to face

## 2014-07-09 ENCOUNTER — Ambulatory Visit (INDEPENDENT_AMBULATORY_CARE_PROVIDER_SITE_OTHER): Payer: PRIVATE HEALTH INSURANCE | Admitting: Gynecology

## 2014-07-09 ENCOUNTER — Ambulatory Visit (INDEPENDENT_AMBULATORY_CARE_PROVIDER_SITE_OTHER): Payer: PRIVATE HEALTH INSURANCE

## 2014-07-09 ENCOUNTER — Other Ambulatory Visit: Payer: Self-pay | Admitting: Gynecology

## 2014-07-09 VITALS — BP 118/74 | Resp 20 | Ht 61.0 in | Wt 190.0 lb

## 2014-07-09 DIAGNOSIS — N924 Excessive bleeding in the premenopausal period: Secondary | ICD-10-CM

## 2014-07-09 NOTE — Progress Notes (Signed)
     Pt here for PUS possible SHG and EMB for episodic menorrhagia.  Last bleed was over 3m ago, she has an elevated [place] c/w menopause done this month. Pt denies any vaginal bleeding since she bled in 03/2014. Images reviewed with pt: Uterus is notable for 4 fibroids the largest of which is posterior upper cervix/lower uterus that is 4.4cm, in addition there is a 3.4cm fundal fibroid and 2 calcified intramural fibroids that are smaller.  Her EMS is well visualized and 2.41mm, smooth, her ovaries appear atrophic. Finding reviewed.  Based on thinness of lining, ovarian appearance and fibroids, EMB deferred.   Pt asked to call for bleeding in future.  Pt agreeable. Impact of fibroids on bleeding reviewed Questions addressed. 89m spent counseling, >50% face to face

## 2014-10-16 ENCOUNTER — Telehealth: Payer: Self-pay | Admitting: Gynecology

## 2014-10-16 NOTE — Telephone Encounter (Signed)
lmtcb to rs aex from 07/10/14

## 2015-01-27 ENCOUNTER — Other Ambulatory Visit: Payer: Self-pay | Admitting: Registered Nurse

## 2015-01-27 ENCOUNTER — Other Ambulatory Visit (HOSPITAL_COMMUNITY)
Admission: RE | Admit: 2015-01-27 | Discharge: 2015-01-27 | Disposition: A | Discharge status: Home / Self Care | Payer: PRIVATE HEALTH INSURANCE | Source: Ambulatory Visit | Attending: Internal Medicine | Admitting: Internal Medicine

## 2015-01-27 DIAGNOSIS — Z01419 Encounter for gynecological examination (general) (routine) without abnormal findings: Secondary | ICD-10-CM | POA: Insufficient documentation

## 2015-01-29 LAB — CYTOLOGY - PAP

## 2015-07-11 ENCOUNTER — Ambulatory Visit: Payer: PRIVATE HEALTH INSURANCE | Admitting: Gynecology

## 2015-07-11 ENCOUNTER — Ambulatory Visit: Payer: PRIVATE HEALTH INSURANCE | Admitting: Nurse Practitioner

## 2016-07-01 ENCOUNTER — Ambulatory Visit (INDEPENDENT_AMBULATORY_CARE_PROVIDER_SITE_OTHER): Payer: PRIVATE HEALTH INSURANCE

## 2016-07-01 ENCOUNTER — Encounter: Payer: Self-pay | Admitting: Podiatry

## 2016-07-01 ENCOUNTER — Ambulatory Visit (INDEPENDENT_AMBULATORY_CARE_PROVIDER_SITE_OTHER): Payer: PRIVATE HEALTH INSURANCE | Admitting: Podiatry

## 2016-07-01 VITALS — BP 137/82 | HR 76 | Resp 16 | Ht 60.0 in | Wt 183.0 lb

## 2016-07-01 DIAGNOSIS — M79672 Pain in left foot: Secondary | ICD-10-CM

## 2016-07-01 DIAGNOSIS — M79671 Pain in right foot: Secondary | ICD-10-CM

## 2016-07-01 DIAGNOSIS — M722 Plantar fascial fibromatosis: Secondary | ICD-10-CM

## 2016-07-01 MED ORDER — TRIAMCINOLONE ACETONIDE 10 MG/ML IJ SUSP
10.0000 mg | Freq: Once | INTRAMUSCULAR | Status: AC
  Administered 2016-07-01: 10 mg

## 2016-07-01 MED ORDER — DICLOFENAC SODIUM 75 MG PO TBEC: 75.0000 mg | DELAYED_RELEASE_TABLET | Freq: Two times a day (BID) | ORAL | 2 refills | Status: DC

## 2016-07-01 NOTE — Progress Notes (Signed)
   Subjective:    Patient ID: Colleen Cantrell, female    DOB: Jan 16, 1966, 50 y.o.   MRN: [id number]  HPI Chief Complaint  Patient presents with  . Foot Pain    Bilateral; plantar; pt stated, "entire bottom of feet hurt"; x2 months      Review of Systems  Skin: Positive for rash.  Neurological: Positive for headaches.  All other systems reviewed and are negative.      Objective:   Physical Exam        Assessment & Plan:

## 2016-07-01 NOTE — Patient Instructions (Signed)

## 2016-07-01 NOTE — Progress Notes (Signed)
Subjective:     Patient ID: Colleen Cantrell, female   DOB: 08/11/1966, 50 y.o.   MRN: [id number]  HPI patient has not been seen in a number of years and presents with significant heel pain bilateral and mild forefoot pain which is most likely compensatory   Review of Systems  All other systems reviewed and are negative.      Objective:   Physical Exam  Constitutional: She is oriented to person, place, and time.  Cardiovascular: Intact distal pulses.   Musculoskeletal: Normal range of motion.  Neurological: She is oriented to person, place, and time.  Skin: Skin is warm.  Nursing note and vitals reviewed.  neurovascular status intact muscle strength adequate range of motion within normal limits with patient found to have exquisite discomfort in the plantar aspect of the heel region bilateral with fluid buildup around the medial band and moderate depression of the arch. Patient's found have good digital perfusion is well oriented 3     Assessment:     Acute plantar fasciitis heel region left over right with fluid buildup and mechanical dysfunction    Plan:     H&P x-rays reviewed and today injected the plantar fascia bilateral 3 mg Kenalog 5 mg Xylocaine and applied fascial brace bilateral. Gave instructions on physical therapy and shoe gear modifications and reappoint to recheck in 2 weeks  X-ray report indicate small spur with no indication of stress fracture or other issues

## 2016-07-05 ENCOUNTER — Telehealth: Payer: Self-pay | Admitting: *Deleted

## 2016-07-05 NOTE — Telephone Encounter (Addendum)
Pt asked how to put on ankle braces.  I told pt to place the sun decal at the back of her heel and make sure the bottom strap was comfortable then it was on correctly. Pt states understanding. 08/13/2016-Pt states she continues to have problems with her feet, balls of the feet swell, but heels are better, medication didn't help, is there any thing else she can take. I told pt she would benefit from being seen again for the forefoot pain, but pt requested a different antiinflammatory pain medication. I told her I would ask Dr. Paulla Dolly on Monday, but if she would be consistent with icing with a fabric covered ice pack 3-4 times daily for 15-106mins/session that would help with the pain and inflammation until I called Monday. Pt states understanding and told me to call her home phone and leave a message.

## 2016-07-15 ENCOUNTER — Encounter: Payer: Self-pay | Admitting: Podiatry

## 2016-07-15 ENCOUNTER — Ambulatory Visit (INDEPENDENT_AMBULATORY_CARE_PROVIDER_SITE_OTHER): Payer: PRIVATE HEALTH INSURANCE | Admitting: Podiatry

## 2016-07-15 DIAGNOSIS — G629 Polyneuropathy, unspecified: Secondary | ICD-10-CM | POA: Diagnosis not present

## 2016-07-15 DIAGNOSIS — M722 Plantar fascial fibromatosis: Secondary | ICD-10-CM | POA: Diagnosis not present

## 2016-07-15 NOTE — Progress Notes (Signed)
Subjective:     Patient ID: Colleen Cantrell, female   DOB: [birth year], 50 y.o.   MRN: [id number]  HPI patient presents stating the heels are still hurting a little bit and I'm still getting some burning in my feet in hot feeling   Review of Systems     Objective:   Physical Exam Neurovascular status intact with significant diminishment of discomfort plantar aspect heels bilateral with pain still present upon deep palpation. Patient is noted to have no other changes with    Assessment:     Fasciitis-like symptoms that appear to be under control with possibility for mild neuropathy or conditions related to menopause    Plan:     H&P condition reviewed discussed continued diclofenac usage supportive shoes anti-inflammatories and if symptoms worsen we will consider oral medication for hot and burning-type pain

## 2017-08-04 ENCOUNTER — Ambulatory Visit: Payer: No Typology Code available for payment source

## 2017-08-11 ENCOUNTER — Ambulatory Visit: Payer: No Typology Code available for payment source

## 2017-08-18 ENCOUNTER — Ambulatory Visit: Payer: No Typology Code available for payment source

## 2017-08-25 ENCOUNTER — Ambulatory Visit: Payer: No Typology Code available for payment source

## 2017-09-01 ENCOUNTER — Ambulatory Visit: Payer: No Typology Code available for payment source

## 2017-09-08 ENCOUNTER — Ambulatory Visit: Payer: No Typology Code available for payment source

## 2017-10-13 ENCOUNTER — Encounter: Payer: PRIVATE HEALTH INSURANCE | Attending: Internal Medicine | Admitting: Dietician

## 2017-10-13 ENCOUNTER — Encounter: Payer: Self-pay | Admitting: Dietician

## 2017-10-13 DIAGNOSIS — E119 Type 2 diabetes mellitus without complications: Secondary | ICD-10-CM | POA: Insufficient documentation

## 2017-10-13 DIAGNOSIS — Z713 Dietary counseling and surveillance: Secondary | ICD-10-CM | POA: Insufficient documentation

## 2017-10-13 DIAGNOSIS — E669 Obesity, unspecified: Secondary | ICD-10-CM | POA: Diagnosis not present

## 2017-10-13 DIAGNOSIS — E782 Mixed hyperlipidemia: Secondary | ICD-10-CM | POA: Insufficient documentation

## 2017-10-13 NOTE — Progress Notes (Signed)

## 2017-10-20 ENCOUNTER — Encounter: Payer: PRIVATE HEALTH INSURANCE | Attending: Internal Medicine | Admitting: Dietician

## 2017-10-20 DIAGNOSIS — E782 Mixed hyperlipidemia: Secondary | ICD-10-CM | POA: Diagnosis not present

## 2017-10-20 DIAGNOSIS — E119 Type 2 diabetes mellitus without complications: Secondary | ICD-10-CM | POA: Diagnosis not present

## 2017-10-20 DIAGNOSIS — E669 Obesity, unspecified: Secondary | ICD-10-CM | POA: Insufficient documentation

## 2017-10-20 DIAGNOSIS — Z713 Dietary counseling and surveillance: Secondary | ICD-10-CM | POA: Diagnosis not present

## 2017-10-20 NOTE — Progress Notes (Signed)
Patient was seen on 10/20/17 for the second of a series of three diabetes self-management courses at the [facility]. The following learning objectives were met by the patient during this class:   Describe the role of different macronutrients on glucose  Explain how carbohydrates affect blood glucose  State what foods contain the most carbohydrates  Demonstrate carbohydrate counting  Demonstrate how to read Nutrition Facts food label  Describe effects of various fats on heart health  Describe the importance of good nutrition for health and healthy eating strategies  Describe techniques for managing your shopping, cooking and meal planning  List strategies to follow meal plan when dining out  Describe the effects of alcohol on glucose and how to use it safely  Goals:  Follow Diabetes Meal Plan as instructed  Aim to spread carbs evenly throughout the day  Aim for 3 meals per day and snacks as needed Include lean protein foods to meals/snacks  Monitor glucose levels as instructed by your doctor   Follow-Up Plan:  Attend Core 3  Work towards following your personal food plan.

## 2017-10-27 ENCOUNTER — Encounter: Payer: PRIVATE HEALTH INSURANCE | Admitting: *Deleted

## 2017-10-27 DIAGNOSIS — E119 Type 2 diabetes mellitus without complications: Secondary | ICD-10-CM | POA: Diagnosis not present

## 2017-11-01 NOTE — Progress Notes (Signed)
Patient was seen on 10/27/2017 for the third of a series of three diabetes self-management courses at the [facility].   Catalina Gravel the amount of activity recommended for healthy living . Describe activities suitable for individual needs . Identify ways to regularly incorporate activity into daily life . Identify barriers to activity and ways to over come these barriers  Identify diabetes medications being personally used and their primary action for lowering glucose and possible side effects . Describe role of stress on blood glucose and develop strategies to address psychosocial issues . Identify diabetes complications and ways to prevent them  Explain how to manage diabetes during illness . Evaluate success in meeting personal goal . Establish 2-3 goals that they will plan to diligently work on until they return for the  73-month follow-up visit  Goals:   I will count my carb choices at most meals and snacks  Smaller portions, more greens  I will be active doing chair exercises at work and walking for 10 minutes 1-2 times a day  I will eat less unhealthy fats   Reduce soda intake to once a week  Your patient has identified these potential barriers to change:  None stated by patient  Your patient has identified their diabetes self-care support plan as   None stated by patient     Plan:  Attend Support Group as desired

## 2017-11-15 HISTORY — PX: OTHER SURGICAL HISTORY: SHX169

## 2018-02-06 ENCOUNTER — Other Ambulatory Visit: Payer: Self-pay | Admitting: Dermatology

## 2019-03-11 ENCOUNTER — Other Ambulatory Visit: Payer: Self-pay

## 2019-03-11 ENCOUNTER — Encounter (HOSPITAL_COMMUNITY): Payer: Self-pay

## 2019-03-11 ENCOUNTER — Emergency Department (HOSPITAL_COMMUNITY): Payer: PRIVATE HEALTH INSURANCE

## 2019-03-11 ENCOUNTER — Emergency Department (HOSPITAL_COMMUNITY)
Admission: EM | Admit: 2019-03-11 | Discharge: 2019-03-11 | Disposition: A | Discharge status: Home / Self Care | Payer: PRIVATE HEALTH INSURANCE | Attending: Emergency Medicine | Admitting: Emergency Medicine

## 2019-03-11 DIAGNOSIS — R531 Weakness: Secondary | ICD-10-CM | POA: Insufficient documentation

## 2019-03-11 DIAGNOSIS — Z79899 Other long term (current) drug therapy: Secondary | ICD-10-CM | POA: Insufficient documentation

## 2019-03-11 DIAGNOSIS — N390 Urinary tract infection, site not specified: Secondary | ICD-10-CM | POA: Diagnosis not present

## 2019-03-11 DIAGNOSIS — R51 Headache: Secondary | ICD-10-CM | POA: Diagnosis not present

## 2019-03-11 DIAGNOSIS — R519 Headache, unspecified: Secondary | ICD-10-CM

## 2019-03-11 DIAGNOSIS — E119 Type 2 diabetes mellitus without complications: Secondary | ICD-10-CM | POA: Insufficient documentation

## 2019-03-11 DIAGNOSIS — H538 Other visual disturbances: Secondary | ICD-10-CM | POA: Insufficient documentation

## 2019-03-11 DIAGNOSIS — R42 Dizziness and giddiness: Secondary | ICD-10-CM | POA: Diagnosis not present

## 2019-03-11 LAB — COMPREHENSIVE METABOLIC PANEL
ALT: 20 U/L (ref 0–44)
AST: 18 U/L (ref 15–41)
Albumin: 4.1 g/dL (ref 3.5–5.0)
Alkaline Phosphatase: 90 U/L (ref 38–126)
Anion gap: 9 (ref 5–15)
BUN: 13 mg/dL (ref 6–20)
CO2: 24 mmol/L (ref 22–32)
Calcium: 9.2 mg/dL (ref 8.9–10.3)
Chloride: 105 mmol/L (ref 98–111)
Creatinine, Ser: 0.72 mg/dL (ref 0.44–1.00)
GFR calc Af Amer: >60 mL/min (ref >60)
GFR calc non Af Amer: >60 mL/min (ref >60)
Glucose, Bld: 131 mg/dL — ABNORMAL HIGH (ref 70–99)
Potassium: 4 mmol/L (ref 3.5–5.1)
Sodium: 138 mmol/L (ref 135–145)
Total Bilirubin: 0.4 mg/dL (ref 0.3–1.2)
Total Protein: 8 g/dL (ref 6.5–8.1)

## 2019-03-11 LAB — APTT: aPTT: 29 seconds (ref 24–36)

## 2019-03-11 LAB — CBC
HCT: 45.6 % (ref 36.0–46.0)
Hemoglobin: 14.9 g/dL (ref 12.0–15.0)
MCH: 28.4 pg (ref 26.0–34.0)
MCHC: 32.7 g/dL (ref 30.0–36.0)
MCV: 87 fL (ref 80.0–100.0)
Platelets: 304 10*3/uL (ref 150–400)
RBC: 5.24 MIL/uL — ABNORMAL HIGH (ref 3.87–5.11)
RDW: 12.7 % (ref 11.5–15.5)
WBC: 9.2 10*3/uL (ref 4.0–10.5)
nRBC: 0 % (ref 0.0–0.2)

## 2019-03-11 LAB — RAPID URINE DRUG SCREEN, HOSP PERFORMED
Amphetamines: NOT DETECTED
Barbiturates: NOT DETECTED
Benzodiazepines: NOT DETECTED
Cocaine: NOT DETECTED
Opiates: NOT DETECTED
Tetrahydrocannabinol: NOT DETECTED

## 2019-03-11 LAB — PROTIME-INR
INR: 1 (ref 0.8–1.2)
Prothrombin Time: 13 seconds (ref 11.4–15.2)

## 2019-03-11 LAB — ETHANOL: Alcohol, Ethyl (B): <10 mg/dL (ref <10)

## 2019-03-11 LAB — URINALYSIS, ROUTINE W REFLEX MICROSCOPIC
Bilirubin Urine: NEGATIVE
Glucose, UA: NEGATIVE mg/dL
Hgb urine dipstick: NEGATIVE
Ketones, ur: NEGATIVE mg/dL
Nitrite: NEGATIVE
Protein, ur: NEGATIVE mg/dL
Specific Gravity, Urine: 1.016 (ref 1.005–1.030)
pH: 8 (ref 5.0–8.0)

## 2019-03-11 LAB — DIFFERENTIAL
Abs Immature Granulocytes: 0.02 10*3/uL (ref 0.00–0.07)
Basophils Absolute: 0.1 10*3/uL (ref 0.0–0.1)
Basophils Relative: 1 %
Eosinophils Absolute: 0.5 10*3/uL (ref 0.0–0.5)
Eosinophils Relative: 5 %
Immature Granulocytes: 0 %
Lymphocytes Relative: 31 %
Lymphs Abs: 2.8 10*3/uL (ref 0.7–4.0)
Monocytes Absolute: 0.7 10*3/uL (ref 0.1–1.0)
Monocytes Relative: 8 %
Neutro Abs: 5 10*3/uL (ref 1.7–7.7)
Neutrophils Relative %: 55 %

## 2019-03-11 LAB — I-STAT BETA HCG BLOOD, ED (MC, WL, AP ONLY): I-stat hCG, quantitative: <5 m[IU]/mL (ref <5)

## 2019-03-11 LAB — I-STAT CREATININE, ED: Creatinine, Ser: 0.6 mg/dL (ref 0.44–1.00)

## 2019-03-11 MED ORDER — CEPHALEXIN 500 MG PO CAPS: 500.0000 mg | ORAL_CAPSULE | Freq: Two times a day (BID) | ORAL | 0 refills | Status: AC

## 2019-03-11 MED ORDER — PROCHLORPERAZINE EDISYLATE 10 MG/2ML IJ SOLN
10.0000 mg | Freq: Once | INTRAMUSCULAR | Status: AC
  Administered 2019-03-11: 21:00:00 10 mg via INTRAVENOUS
  Filled 2019-03-11: qty 2

## 2019-03-11 MED ORDER — SODIUM CHLORIDE 0.9 % IV BOLUS
1000.0000 mL | Freq: Once | INTRAVENOUS | Status: AC
  Administered 2019-03-11: 1000 mL via INTRAVENOUS

## 2019-03-11 MED ORDER — DIPHENHYDRAMINE HCL 50 MG/ML IJ SOLN
25.0000 mg | Freq: Once | INTRAMUSCULAR | Status: AC
  Administered 2019-03-11: 25 mg via INTRAVENOUS
  Filled 2019-03-11: qty 1

## 2019-03-11 NOTE — ED Triage Notes (Signed)
States about 1700 pm today blurred vision with dizziness and EMS checked blood pressure 198/100 blood sugar 143.  And states still light headed and blurred vision. States she had a headache.

## 2019-03-11 NOTE — ED Notes (Signed)
Pt given and verbalized understanding of d/c instructions and need for follow up with pcp. Told to return if s/s worsen. No further distress or questions upon ambulation out of department.  

## 2019-03-11 NOTE — ED Notes (Addendum)
MRI called and voicemail left.

## 2019-03-11 NOTE — ED Provider Notes (Signed)
[facility] DEPT Provider Note   CSN: [id number] Arrival date & time: 03/11/19  1832    History   Chief Complaint Chief Complaint  Patient presents with  . Dizziness    HPI [place] is a 53 y.o. female.     HPI   53yo female with history of DM, hyperlipidemia, presents with concern for acute onset of dizziness at 5PM.  Reports she has had left hand weakness that she thought was due to sleeping position over the last several weeks. Today around 5PM she felt "drunk", off balance, lightheaded.  She reports that she had blurred vision in both eyes which has now significantly improved.  Denies double vision, visual field deficits.  Reports sensation of lightheadedness and feeling that her vision was blurred all over.  Did not have acute numbness or weakness, but reported the left hand weakness which had been ongoing previously. Reports associated headache, beginning in the back and radiating forwards.  Denies trauma.  Denies fevers, nausea, chest pain, shortness of breath, palpitations.  No history of CVA.  Past Medical History:  Diagnosis Date  . Diabetes mellitus without complication ([place])   . Endometriosis   . History of uterine fibroid   . Hyperlipidemia     Patient Active Problem List   Diagnosis Date Noted  . SYNDROME, CARPAL TUNNEL 06/28/2007  . GERD 06/28/2007    Past Surgical History:  Procedure Laterality Date  . breast biopsies Left 6/15   benign  . CARPAL TUNNEL RELEASE    . esophagus stretching    . LASER LAPAROSCOPY  1987   fulgurization of endometriosis, no bx     OB History    Gravida  0   Para      Term      Preterm      AB      Living        SAB      TAB      Ectopic      Multiple      Live Births               Home Medications    Prior to Admission medications   Medication Sig Start Date End Date Taking? Authorizing Provider  Icosapent Ethyl (VASCEPA) 1 g CAPS Take 2 capsules by mouth  daily.   Yes [provider]  Multiple Vitamins-Minerals (MULTIVITAMIN ADULT) TABS Take 1 tablet by mouth daily.   Yes [provider]  cephALEXin (KEFLEX) 500 MG capsule Take 1 capsule (500 mg total) by mouth 2 (two) times daily for 7 days. 03/11/19 03/18/19  Gareth Morgan, MD  diclofenac (VOLTAREN) 75 MG EC tablet Take 1 tablet (75 mg total) by mouth 2 (two) times daily. Patient not taking: Reported on 03/11/2019 07/01/16   Wallene Huh, DPM    Family History Family History  Problem Relation Age of Onset  . Hypertension Mother   . Cancer Mother 41    Social History Social History   Tobacco Use  . Smoking status: Never Smoker  . Smokeless tobacco: Never Used  Substance Use Topics  . Alcohol use: No  . Drug use: No     Allergies   Rifampin and Sulfonamide derivatives   Review of Systems Review of Systems  Constitutional: Negative for fever.  HENT: Negative for sore throat.   Eyes: Positive for visual disturbance.  Respiratory: Negative for cough and shortness of breath.   Cardiovascular: Negative for chest pain.  Gastrointestinal:  Negative for abdominal pain, nausea and vomiting.  Genitourinary: Negative for difficulty urinating.  Musculoskeletal: Negative for back pain and neck pain.  Skin: Negative for rash.  Neurological: Positive for dizziness, weakness (left hand), light-headedness and headaches. Negative for syncope, facial asymmetry, speech difficulty and numbness.     Physical Exam Updated Vital Signs BP (!) 146/84   Pulse 90   Temp 98 F (36.7 C) (Oral)   Resp 12   Ht 5\' 1"  (1.549 m)   Wt 84.8 kg   SpO2 98%   BMI 35.33 kg/m   Physical Exam Vitals signs and nursing note reviewed.  Constitutional:      General: She is not in acute distress.    Appearance: She is well-developed. She is not diaphoretic.  HENT:     Head: Normocephalic and atraumatic.  Eyes:     General: No visual field deficit.    Conjunctiva/sclera:  Conjunctivae normal.  Neck:     Musculoskeletal: Normal range of motion.  Cardiovascular:     Rate and Rhythm: Normal rate and regular rhythm.     Heart sounds: Normal heart sounds. No murmur. No friction rub. No gallop.   Pulmonary:     Effort: Pulmonary effort is normal. No respiratory distress.     Breath sounds: Normal breath sounds. No wheezing or rales.  Abdominal:     General: There is no distension.     Palpations: Abdomen is soft.     Tenderness: There is no abdominal tenderness. There is no guarding.  Musculoskeletal:        General: No tenderness.  Skin:    General: Skin is warm and dry.     Findings: No erythema or rash.  Neurological:     Mental Status: She is alert and oriented to person, place, and time.     GCS: GCS eye subscore is 4. GCS verbal subscore is 5. GCS motor subscore is 6.     Cranial Nerves: Cranial nerves are intact. No cranial nerve deficit, dysarthria or facial asymmetry.     Sensory: Sensation is intact. No sensory deficit.     Motor: Motor function is intact. No pronator drift.     Coordination: Coordination is intact. Coordination normal.     Gait: Abnormal gait: walking slowly, reports feels she is walking sideways but does not appear to be ataxic.      ED Treatments / Results  Labs (all labs ordered are listed, but only abnormal results are displayed) Labs Reviewed  CBC - Abnormal; Notable for the following components:      Result Value   RBC 5.24 (*)    All other components within normal limits  COMPREHENSIVE METABOLIC PANEL - Abnormal; Notable for the following components:   Glucose, Bld 131 (*)    All other components within normal limits  URINALYSIS, ROUTINE W REFLEX MICROSCOPIC - Abnormal; Notable for the following components:   APPearance HAZY (*)    Leukocytes,Ua MODERATE (*)    Bacteria, UA RARE (*)    All other components within normal limits  ETHANOL  PROTIME-INR  APTT  DIFFERENTIAL  RAPID URINE DRUG SCREEN, HOSP  PERFORMED  I-STAT CREATININE, ED  I-STAT BETA HCG BLOOD, ED ([facility], [facility], [facility] ONLY)    EKG EKG Interpretation  Date/Time:  Sunday March 11 2019 19:27:15 EDT Ventricular Rate:  97 PR Interval:    QRS Duration: 120 QT Interval:  364 QTC Calculation: 463 R Axis:   -25 Text Interpretation:  Sinus rhythm LVH with  secondary repolarization abnormality Anterior infarct, old No previous ECGs available Confirmed by Gareth Morgan [phone]) on 03/11/2019 8:45:53 PM   Radiology Ct Head Wo Contrast  Result Date: 03/11/2019 CLINICAL DATA:  53 year old female with vertigo, dizziness. EXAM: CT HEAD WITHOUT CONTRAST TECHNIQUE: Contiguous axial images were obtained from the base of the skull through the vertex without intravenous contrast. COMPARISON:  None. FINDINGS: Brain: Cerebral volume is within normal limits for age. No midline shift, ventriculomegaly, mass effect, evidence of mass lesion, intracranial hemorrhage or evidence of cortically based acute infarction. Gray-white matter differentiation is within normal limits throughout the brain. Vascular: Mild Calcified atherosclerosis at the skull base. No suspicious intracranial vascular hyperdensity. Skull: Negative. Sinuses/Orbits: Bilateral tympanic cavities appear clear. Paranasal sinuses and mastoids are well pneumatized. Other: Negative orbit and scalp soft tissues. IMPRESSION: Negative non contrast head CT. Electronically Signed   By: Genevie Ann M.D.   On: 03/11/2019 19:59   Mr Brain Wo Contrast  Result Date: 03/11/2019 CLINICAL DATA:  53 year old female with blurred vision dizziness and headaches since 1700 hours today. EXAM: MRI HEAD WITHOUT CONTRAST TECHNIQUE: Multiplanar, multiecho pulse sequences of the brain and surrounding structures were obtained without intravenous contrast. COMPARISON:  Head CT earlier today. FINDINGS: Brain: No restricted diffusion to suggest acute infarction. No midline shift, mass effect, evidence of mass lesion,  ventriculomegaly, extra-axial collection or acute intracranial hemorrhage. Cervicomedullary junction and pituitary are within normal limits. Pearline Cables and white matter signal is within normal limits for age throughout the brain. No cortical encephalomalacia or chronic cerebral blood products identified. Vascular: Major intracranial vascular flow voids are preserved. Skull and upper cervical spine: Negative visible cervical spine. Visualized bone marrow signal is within normal limits. Sinuses/Orbits: Negative orbits. Trace paranasal sinus mucosal thickening. Other: Mastoids are clear. Grossly normal visible internal auditory structures. Normal stylomastoid foramina. Scalp and face soft tissues appear negative. IMPRESSION: Normal for age noncontrast MRI appearance of the brain. Electronically Signed   By: Genevie Ann M.D.   On: 03/11/2019 22:48    Procedures Procedures (including critical care time)  Medications Ordered in ED Medications  sodium chloride 0.9 % bolus 1,000 mL (0 mLs Intravenous Stopped 03/11/19 2243)  prochlorperazine (COMPAZINE) injection 10 mg (10 mg Intravenous Given 03/11/19 2057)  diphenhydrAMINE (BENADRYL) injection 25 mg (25 mg Intravenous Given 03/11/19 2056)     Initial Impression / Assessment and Plan / ED Course  I have reviewed the triage vital signs and the nursing notes.  Pertinent labs & imaging results that were available during my care of the patient were reviewed by me and considered in my medical decision making (see chart for details).        53yo female with history of DM, hyperlipidemia, presents with concern for acute onset of dizziness at 5PM.  Evaluated patient on arrival, do not feel her history and exam warrant code stroke at this time given ongoing left hand weakness for weeks, as well as no significant abnormalities on exam.  It is unclear by history whether dizziness represents a central cause, or represents more of a global lightheadedness.  She denies chest  pain, shortness of breath, have low suspicion for ACS, pulmonary pulmonary embolus.  Labs show no sign of significant anemia or electrolyte abnormalities.  EKG shows a sinus rhythm with no previous for comparison.  CT head without acute abnormalities.  Will obtain MRI for further evaluation for CVA as a cause of her disequilibrium.  Ordered headache cocktail, IV fluids.  MRI shows no acute abnormality.  Given bilateral blurred vision with lightheadedness, do not feel she requires TIA admission as symptoms may be due to pre-syncope rather than neurologic baseline. No double vision or visual field defects. Exam/hx not consistent with glaucoma.  In setting of headache, symptoms may be related to migraines. Symptoms may also have been secondary to BP however they have improved to 140s in the ED and do not feel she requires htn urgency admission.  UA is borderline for UTI which may cause her to feel generally unwell, dizzy. Will treat with keflex and recommend outpatient follow up. Patient discharged in stable condition with understanding of reasons to return.   Final Clinical Impressions(s) / ED Diagnoses   Final diagnoses:  Dizziness  Acute nonintractable headache, unspecified headache type  Urinary tract infection without hematuria, site unspecified    ED Discharge Orders         Ordered    cephALEXin (KEFLEX) 500 MG capsule  2 times daily     03/11/19 2328           Gareth Morgan, MD 03/11/19 [phone]

## 2019-03-11 NOTE — ED Notes (Signed)
Patient transported to MRI 

## 2019-10-25 ENCOUNTER — Ambulatory Visit: Payer: No Typology Code available for payment source | Admitting: Family Medicine

## 2019-10-25 VITALS — BP 132/80 | HR 78 | Ht 61.0 in | Wt 180.3 lb

## 2019-10-25 DIAGNOSIS — Z6834 Body mass index (BMI) 34.0-34.9, adult: Secondary | ICD-10-CM

## 2019-10-25 DIAGNOSIS — L905 Scar conditions and fibrosis of skin: Secondary | ICD-10-CM

## 2019-10-25 NOTE — Patient Instructions (Addendum)
-Apply 1% hydrocortisone cream that is over the counter to area twice daily for the next 1-2 weeks.  -Keep area clean and dry. Use soap and warm water.  -If you notice increased redness, drainage, warmth around the area, fever please call for follow up. -Call if no improvement   Follow up with your primary care doctor or GYN about your post-menopausal spotting from last year.   DASH Eating Plan DASH stands for "Dietary Approaches to Stop Hypertension." The DASH eating plan is a healthy eating plan that has been shown to reduce high blood pressure (hypertension). It may also reduce your risk for type 2 diabetes, heart disease, and stroke. The DASH eating plan may also help with weight loss. What are tips for following this plan?  General guidelines  Avoid eating more than 2,300 mg (milligrams) of salt (sodium) a day. If you have hypertension, you may need to reduce your sodium intake to 1,500 mg a day.  Limit alcohol intake to no more than 1 drink a day for nonpregnant women and 2 drinks a day for men. One drink equals 12 oz of beer, 5 oz of wine, or 1 oz of hard liquor.  Work with your health care provider to maintain a healthy body weight or to lose weight. Ask what an ideal weight is for you.  Get at least 30 minutes of exercise that causes your heart to beat faster (aerobic exercise) most days of the week. Activities may include walking, swimming, or biking.  Work with your health care provider or diet and nutrition specialist (dietitian) to adjust your eating plan to your individual calorie needs. Reading food labels   Check food labels for the amount of sodium per serving. Choose foods with less than 5 percent of the Daily Value of sodium. Generally, foods with less than 300 mg of sodium per serving fit into this eating plan.  To find whole grains, look for the word "whole" as the first word in the ingredient list. Shopping  Buy products labeled as "low-sodium" or "no salt  added."  Buy fresh foods. Avoid canned foods and premade or frozen meals. Cooking  Avoid adding salt when cooking. Use salt-free seasonings or herbs instead of table salt or sea salt. Check with your health care provider or pharmacist before using salt substitutes.  Do not fry foods. Cook foods using healthy methods such as baking, boiling, grilling, and broiling instead.  Cook with heart-healthy oils, such as olive, canola, soybean, or sunflower oil. Meal planning  Eat a balanced diet that includes: ? 5 or more servings of fruits and vegetables each day. At each meal, try to fill half of your plate with fruits and vegetables. ? Up to 6-8 servings of whole grains each day. ? Less than 6 oz of lean meat, poultry, or fish each day. A 3-oz serving of meat is about the same size as a deck of cards. One egg equals 1 oz. ? 2 servings of low-fat dairy each day. ? A serving of nuts, seeds, or beans 5 times each week. ? Heart-healthy fats. Healthy fats called Omega-3 fatty acids are found in foods such as flaxseeds and coldwater fish, like sardines, salmon, and mackerel.  Limit how much you eat of the following: ? Canned or prepackaged foods. ? Food that is high in trans fat, such as fried foods. ? Food that is high in saturated fat, such as fatty meat. ? Sweets, desserts, sugary drinks, and other foods with added sugar. ? Full-fat  dairy products.  Do not salt foods before eating.  Try to eat at least 2 vegetarian meals each week.  Eat more home-cooked food and less restaurant, buffet, and fast food.  When eating at a restaurant, ask that your food be prepared with less salt or no salt, if possible. What foods are recommended? The items listed may not be a complete list. Talk with your dietitian about what dietary choices are best for you. Grains Whole-grain or whole-wheat bread. Whole-grain or whole-wheat pasta. Brown rice. [place]row. Bulgur. Whole-grain and low-sodium cereals.  Pita bread. Low-fat, low-sodium crackers. Whole-wheat flour tortillas. Vegetables Fresh or frozen vegetables (raw, steamed, roasted, or grilled). Low-sodium or reduced-sodium tomato and vegetable juice. Low-sodium or reduced-sodium tomato sauce and tomato paste. Low-sodium or reduced-sodium canned vegetables. Fruits All fresh, dried, or frozen fruit. Canned fruit in natural juice (without added sugar). Meat and other protein foods Skinless chicken or [place]. Ground chicken or [place]. Pork with fat trimmed off. Fish and seafood. Egg whites. Dried beans, peas, or lentils. Unsalted nuts, nut butters, and seeds. Unsalted canned beans. Lean cuts of beef with fat trimmed off. Low-sodium, lean deli meat. Dairy Low-fat (1%) or fat-free (skim) milk. Fat-free, low-fat, or reduced-fat cheeses. Nonfat, low-sodium ricotta or cottage cheese. Low-fat or nonfat yogurt. Low-fat, low-sodium cheese. Fats and oils Soft margarine without trans fats. Vegetable oil. Low-fat, reduced-fat, or light mayonnaise and salad dressings (reduced-sodium). Canola, safflower, olive, soybean, and sunflower oils. Avocado. Seasoning and other foods Herbs. Spices. Seasoning mixes without salt. Unsalted popcorn and pretzels. Fat-free sweets. What foods are not recommended? The items listed may not be a complete list. Talk with your dietitian about what dietary choices are best for you. Grains Baked goods made with fat, such as croissants, muffins, or some breads. Dry pasta or rice meal packs. Vegetables Creamed or fried vegetables. Vegetables in a cheese sauce. Regular canned vegetables (not low-sodium or reduced-sodium). Regular canned tomato sauce and paste (not low-sodium or reduced-sodium). Regular tomato and vegetable juice (not low-sodium or reduced-sodium). Angie Fava. Olives. Fruits Canned fruit in a light or heavy syrup. Fried fruit. Fruit in cream or butter sauce. Meat and other protein foods Fatty cuts of meat. Ribs. Fried  meat. Berniece Salines. Sausage. Bologna and other processed lunch meats. Salami. Fatback. Hotdogs. Bratwurst. Salted nuts and seeds. Canned beans with added salt. Canned or smoked fish. Whole eggs or egg yolks. Chicken or [place] with skin. Dairy Whole or 2% milk, cream, and half-and-half. Whole or full-fat cream cheese. Whole-fat or sweetened yogurt. Full-fat cheese. Nondairy creamers. Whipped toppings. Processed cheese and cheese spreads. Fats and oils Butter. Stick margarine. Lard. Shortening. Ghee. Bacon fat. Tropical oils, such as coconut, palm kernel, or palm oil. Seasoning and other foods Salted popcorn and pretzels. Onion salt, garlic salt, seasoned salt, table salt, and sea salt. Worcestershire sauce. Tartar sauce. Barbecue sauce. Teriyaki sauce. Soy sauce, including reduced-sodium. Steak sauce. Canned and packaged gravies. Fish sauce. Oyster sauce. Cocktail sauce. Horseradish that you find on the shelf. Ketchup. Mustard. Meat flavorings and tenderizers. Bouillon cubes. Hot sauce and Tabasco sauce. Premade or packaged marinades. Premade or packaged taco seasonings. Relishes. Regular salad dressings. Where to find more information:  National Heart, Lung, and [place]: https://wilson-eaton.com/  American Heart Association: www.heart.org Summary  The DASH eating plan is a healthy eating plan that has been shown to reduce high blood pressure (hypertension). It may also reduce your risk for type 2 diabetes, heart disease, and stroke.  With the DASH eating plan, you should  limit salt (sodium) intake to 2,300 mg a day. If you have hypertension, you may need to reduce your sodium intake to 1,500 mg a day.  When on the DASH eating plan, aim to eat more fresh fruits and vegetables, whole grains, lean proteins, low-fat dairy, and heart-healthy fats.  Work with your health care provider or diet and nutrition specialist (dietitian) to adjust your eating plan to your individual calorie needs. This information is  not intended to replace advice given to you by your health care provider. Make sure you discuss any questions you have with your health care provider. Document Released: 10/21/2011 Document Revised: 10/14/2017 Document Reviewed: 10/25/2016 Elsevier Patient Education  2020 [employer].

## 2019-10-25 NOTE — Progress Notes (Signed)
  Subjective:     Patient ID: Colleen Cantrell, female   DOB: 09/04/1966, 53 y.o.   MRN: [id number]  HPI   Colleen Cantrell presents to the [facility] clinic today for her required visit for her insurance as well as to discuss issues she is having with an old scar. We reviewed her PMH. She reports she has high blood pressure, she does not check it at home but states it has been doing good. She denies any hx of diabetes, though this was listed in her record. She is on medication for elevated cholesterol and states this is working well for her. She saw her PCP, Dr. Shelia Media in September 2020 for her physical and had blood work done then.   Health Maintenance:   Flu shot - September Eye exam several years ago - will schedule appt for this year Colonoscopy last year - 5 year f/u  LMP: spotted some last year - had previously been post-menopausal several years, has not had any bleeding since. She states she f/u with her PCP about this and was told it was normal. Pap smear last year normal Mammogram this year and normal   Wellness goals: Exercise more - power walk 30 min a day, 4 days a week. Increase intake of fruits and vegetables.  Scar to left underarm 6 years ago d/t incised boil, infected sweat gland, 3 weeks ago noticed redness and scabbing to area, no drainage, some itching. No new deodorants, lotions, detergents, etc. No rubbing or chafing. No fevers. States is unable to wear deodorant d/t causing boils.    Review of Systems  Constitutional: Negative for chills, fever and unexpected weight change.  Respiratory: Negative for shortness of breath.   Cardiovascular: Negative for chest pain.  Skin: Negative for rash.  Hematological: Negative for adenopathy.       Objective:   Physical Exam Vitals reviewed.  Constitutional:      General: She is not in acute distress.    Appearance: Normal appearance. She is obese. She is not toxic-appearing.  HENT:     Head: Normocephalic and atraumatic.  Cardiovascular:      Rate and Rhythm: Normal rate.  Pulmonary:     Effort: Pulmonary effort is normal. No respiratory distress.  Skin:    General: Skin is warm and dry.     Comments: 3 inch scar noted to left axilla, minimal erythema, some flaking scaly skin on top of scar tissue. No lymphadenopathy. No warmth or drainage noted.   Neurological:     Mental Status: She is alert and oriented to person, place, and time.        Assessment:     BMI 34.0-34.9,adult  Scar tissue      Plan:    1. Discussed healthy diet and exercise. Importance of lifestyle factors to control high blood pressure and cholesterol and decrease risk of heart disease, stroke, and diabetes. Continue regular f/u with PCP as indicated. Recommend regular vision exams. Recommend f/u with GYN d/t post-menopausal bleeding last year. 2. Area surrounding scar does not appear to be infected at this time. Recommend treatment with otc 1% hydrocortisone bid x 1-2 weeks. If no improvement or worsening in condition call for f/u may need abx treatment at that time. Pt verbalized understanding.

## 2020-03-14 ENCOUNTER — Ambulatory Visit: Payer: No Typology Code available for payment source | Admitting: Family Medicine

## 2020-06-30 ENCOUNTER — Other Ambulatory Visit (HOSPITAL_COMMUNITY)
Admission: RE | Admit: 2020-06-30 | Discharge: 2020-06-30 | Disposition: A | Discharge status: Home / Self Care | Payer: 59 | Source: Ambulatory Visit | Attending: Obstetrics and Gynecology | Admitting: Obstetrics and Gynecology

## 2020-06-30 ENCOUNTER — Ambulatory Visit (INDEPENDENT_AMBULATORY_CARE_PROVIDER_SITE_OTHER): Payer: 59 | Admitting: Obstetrics and Gynecology

## 2020-06-30 ENCOUNTER — Other Ambulatory Visit: Payer: Self-pay

## 2020-06-30 ENCOUNTER — Encounter: Payer: Self-pay | Admitting: Obstetrics and Gynecology

## 2020-06-30 VITALS — BP 120/78 | HR 90 | Ht 61.0 in | Wt 191.0 lb

## 2020-06-30 DIAGNOSIS — Z124 Encounter for screening for malignant neoplasm of cervix: Secondary | ICD-10-CM | POA: Diagnosis present

## 2020-06-30 DIAGNOSIS — N950 Postmenopausal bleeding: Secondary | ICD-10-CM

## 2020-06-30 MED ORDER — MISOPROSTOL 200 MCG PO TABS: ORAL_TABLET | ORAL | 0 refills | Status: DC

## 2020-06-30 NOTE — Patient Instructions (Signed)
Postmenopausal Bleeding  Postmenopausal bleeding is any bleeding that a woman has after she has entered into menopause. Menopause is the end of a woman's fertile years. After menopause, a woman no longer ovulates and does not have menstrual periods. Postmenopausal bleeding may have various causes, including:  Menopausal hormone therapy (MHT).  Endometrial atrophy. After menopause, low estrogen hormone levels cause the membrane that lines the uterus (endometrium) to become thinner. You may have bleeding as the endometrium thins.  Endometrial hyperplasia. This condition is caused by excess estrogen hormones and low levels of progesterone hormones. The excess estrogen causes the endometrium to thicken, which can lead to bleeding. In some cases, this can lead to cancer of the uterus.  Endometrial cancer.  Non-cancerous growths (polyps) on the endometrium, the lining of the uterus, or the cervix.  Uterine fibroids. These are non-cancerous growths in or around the uterus muscle tissue that can cause heavy bleeding. Any type of postmenopausal bleeding, even if it appears to be a typical menstrual period, should be evaluated by your health care provider. Treatment will depend on the cause of the bleeding. Follow these instructions at home:  Pay attention to any changes in your symptoms.  Avoid using tampons and douches as told by your health care provider.  Change your pads regularly.  Get regular pelvic exams and Pap tests.  Take iron supplements as told by your health care provider.  Take over-the-counter and prescription medicines only as told by your health care provider.  Keep all follow-up visits as told by your health care provider. This is important. Contact a health care provider if:  Your bleeding lasts more than 1 week.  You have abdominal pain.  You have bleeding with or after sexual intercourse.  You have bleeding that happens more often than every 3 weeks. Get help  right away if:  You have a fever, chills, headache, dizziness, muscle aches, and bleeding.  You have severe pain with bleeding.  You are passing blood clots.  You have heavy bleeding, need more than 1 pad an hour, and have never experienced this before.  You feel faint. Summary  Postmenopausal bleeding is any bleeding that a woman has after she has entered into menopause.  Postmenopausal bleeding may have various causes. Treatment will depend on the cause of the bleeding.  Any type of postmenopausal bleeding, even if it appears to be a typical menstrual period, should be evaluated by your health care provider.  Be sure to pay attention to any changes in your symptoms and keep all follow-up visits as told by your health care provider. This information is not intended to replace advice given to you by your health care provider. Make sure you discuss any questions you have with your health care provider. Document Revised: 02/08/2018 Document Reviewed: 01/25/2017 Elsevier Patient Education  2020 Elsevier Inc.  

## 2020-06-30 NOTE — Progress Notes (Signed)
54 y.o. G0P0 Single White or Caucasian Not Hispanic or Latino female here for post menopausal bleeding. On July 27-28 she had bright red bleeding. She states that a few days later it started again and it was enough to fill a mini pad. Now she says that she is just spotting. She states that during the time of her bleeding she stopped taking her Icosapent ethyl (omega 3 fatty acid). She states that the bleeding seemed to get better after that.  No pain with the bleeding, no recent intercourse.   She does report spotting 3 years ago.   PMP for many years. Feet are hot all the time. Was told it's not neuropathy. No other vasomotor symptoms.     Patient's last menstrual period was 04/08/2014.          Sexually active: No.  The current method of family planning is post menopausal status.    Exercising: Yes.    The patient does not participate in regular exercise at present. Smoker:  no  Health Maintenance: Pap:  01/26/17 none  History of abnormal Pap:  H/O leep in her 20's MMG:  2020 with [facility]  BMD:   None  Colonoscopy: none  TDaP:  Doesn't think it up to date  Gardasil: NA   reports that she has never smoked. She has never used smokeless tobacco. She reports current alcohol use. She reports that she does not use drugs. She worked in [employer], lost her job.   Past Medical History:  Diagnosis Date  . Endometriosis   . Fibroid   . History of uterine fibroid   . Hyperlipidemia   . Hypertension     Past Surgical History:  Procedure Laterality Date  . breast biopsies Left 6/15   benign  . CARPAL TUNNEL RELEASE    . CERVICAL BIOPSY  W/ LOOP ELECTRODE EXCISION     she was in her mid 20's  . esophagus stretching    . LASER LAPAROSCOPY  1987   fulgurization of endometriosis, no bx    Current Outpatient Medications  Medication Sig Dispense Refill  . escitalopram (LEXAPRO) 10 MG tablet Take 10 mg by mouth daily.    Marland Kitchen losartan (COZAAR) 50 MG tablet Take 50 mg by mouth daily.    .  Multiple Vitamins-Minerals (MULTIVITAMIN ADULT) TABS Take 1 tablet by mouth daily.    . rosuvastatin (CRESTOR) 10 MG tablet Take 10 mg by mouth daily.     No current facility-administered medications for this visit.    Family History  Problem Relation Age of Onset  . Hypertension Mother   . Cancer Mother 64    Review of Systems  Genitourinary: Positive for vaginal bleeding.  All other systems reviewed and are negative.   Exam:   BP 120/78   Pulse 90   Ht 5\' 1"  (1.549 m)   Wt 191 lb (86.6 kg)   LMP 04/08/2014   SpO2 98%   BMI 36.09 kg/m   Weight change: @WEIGHTCHANGE @ Height:   Height: 5\' 1"  (154.9 cm)  Ht Readings from Last 3 Encounters:  06/30/20 5\' 1"  (1.549 m)  10/25/19 5\' 1"  (1.549 m)  03/11/19 5\' 1"  (1.549 m)    General appearance: alert, cooperative and appears stated age Head: Normocephalic, without obvious abnormality, atraumatic Neck: no adenopathy, supple, symmetrical, trachea midline and thyroid normal to inspection and palpation Lungs: clear to auscultation bilaterally Cardiovascular: regular rate and rhythm Abdomen: soft, non-tender; non distended,  no masses,  no organomegaly Extremities: extremities  normal, atraumatic, no cyanosis or edema Skin: Skin color, texture, turgor normal. No rashes or lesions Lymph nodes: Cervical, supraclavicular, and axillary nodes normal. No abnormal inguinal nodes palpated Neurologic: Grossly normal   Pelvic: External genitalia:  no lesions              Urethra:  normal appearing urethra with no masses, tenderness or lesions              Bartholins and Skenes: normal                 Vagina: normal appearing vagina with normal color and discharge, no lesions, brownish discharge seen.               Cervix: only anterior lip of cervix was seen. Could only tolerate the pediatric speculum. Very uncomfortable with exam.                Bimanual Exam:  Uterus:  no masses or tenderness, limited by BMI.               Adnexa: no  mass, fullness, tenderness               Rectovaginal: Confirms               Anus:  normal sphincter tone, no lesions  Gae Dry chaperoned for the exam.  Ultrasound images from 8/15 reviewed, uterus slightly enlarged, several small myomas largest ~4 cm.   A:  PMP bleeding, unable to clearly see her cervix, very uncomfortable with exam.   P:   Pap with hpv  Will have her return for a sonohysterogram  Will pre-treat with cytotec.   Possible endometrial biopsy

## 2020-07-01 ENCOUNTER — Telehealth: Payer: Self-pay | Admitting: Obstetrics and Gynecology

## 2020-07-01 LAB — CYTOLOGY - PAP
Comment: NEGATIVE
Diagnosis: NEGATIVE
High risk HPV: NEGATIVE

## 2020-07-01 NOTE — Telephone Encounter (Signed)
Spoke with patient regarding benefits for recommended Sonohysterogram. Patient acknowledges understanding of information presented. Patient is aware of cancellation policy. Encounter closed.

## 2020-07-14 ENCOUNTER — Telehealth: Payer: Self-pay

## 2020-07-14 MED ORDER — IBUPROFEN 800 MG PO TABS: 800.0000 mg | ORAL_TABLET | Freq: Three times a day (TID) | ORAL | 0 refills | Status: AC | PRN

## 2020-07-14 NOTE — Telephone Encounter (Signed)
Spoke with pt. Pt wanting to know if can have Rx Ibuprofen before [facility] tomorrow. Pt given recommendations per Dr Talbert Nan to only take 1 hour before procedure to not impact Cytotec Rx. Pt aware and agreeable.  Rx Ibuprofen # 30, 0RF sent to pharmacy on file.  Encounter closed.

## 2020-07-14 NOTE — Telephone Encounter (Signed)
Patient stated she was prescribed Misoprostol to take before ultrasound. Patient stated she was told it could cause cramping and would like to know if she could take something for the cramping if needed.

## 2020-07-15 ENCOUNTER — Ambulatory Visit (INDEPENDENT_AMBULATORY_CARE_PROVIDER_SITE_OTHER): Payer: 59

## 2020-07-15 ENCOUNTER — Other Ambulatory Visit: Payer: Self-pay | Admitting: Obstetrics and Gynecology

## 2020-07-15 ENCOUNTER — Other Ambulatory Visit: Payer: Self-pay

## 2020-07-15 ENCOUNTER — Encounter: Payer: Self-pay | Admitting: Obstetrics and Gynecology

## 2020-07-15 ENCOUNTER — Ambulatory Visit (INDEPENDENT_AMBULATORY_CARE_PROVIDER_SITE_OTHER): Payer: 59 | Admitting: Obstetrics and Gynecology

## 2020-07-15 VITALS — BP 110/72 | HR 103 | Ht 61.0 in | Wt 191.0 lb

## 2020-07-15 DIAGNOSIS — N840 Polyp of corpus uteri: Secondary | ICD-10-CM | POA: Diagnosis not present

## 2020-07-15 DIAGNOSIS — N950 Postmenopausal bleeding: Secondary | ICD-10-CM | POA: Diagnosis not present

## 2020-07-15 MED ORDER — MISOPROSTOL 200 MCG PO TABS: ORAL_TABLET | ORAL | 0 refills | Status: DC

## 2020-07-15 NOTE — Progress Notes (Signed)
GYNECOLOGY  VISIT   HPI: 54 y.o.   Single White or Caucasian Not Hispanic or Latino  female   G0P0000 with Patient's last menstrual period was 04/08/2014.   here for ultrasound consult for PMP bleeding. The patient started bleeding at the end of July and was still spotting at the time of her last visit on 06/30/20.  She pretreated with cytotec and is bleeding again today. No significant cramping.   GYNECOLOGIC HISTORY: Patient's last menstrual period was 04/08/2014. Contraception:PMP Menopausal hormone therapy: none         OB History    Gravida  0   Para  0   Term  0   Preterm  0   AB  0   Living  0     SAB  0   TAB  0   Ectopic  0   Multiple  0   Live Births  0              Patient Active Problem List   Diagnosis Date Noted  . SYNDROME, CARPAL TUNNEL 06/28/2007  . GERD 06/28/2007    Past Medical History:  Diagnosis Date  . Endometriosis   . Fibroid   . History of uterine fibroid   . Hyperlipidemia   . Hypertension     Past Surgical History:  Procedure Laterality Date  . breast biopsies Left 6/15   benign  . CARPAL TUNNEL RELEASE    . CERVICAL BIOPSY  W/ LOOP ELECTRODE EXCISION     she was in her mid 20's  . esophagus stretching    . LASER LAPAROSCOPY  1987   fulgurization of endometriosis, no bx    Current Outpatient Medications  Medication Sig Dispense Refill  . escitalopram (LEXAPRO) 10 MG tablet Take 10 mg by mouth daily.    Marland Kitchen ibuprofen (ADVIL) 800 MG tablet Take 1 tablet (800 mg total) by mouth every 8 (eight) hours as needed. 30 tablet 0  . losartan (COZAAR) 50 MG tablet Take 50 mg by mouth daily.    . Multiple Vitamins-Minerals (MULTIVITAMIN ADULT) TABS Take 1 tablet by mouth daily.    . rosuvastatin (CRESTOR) 10 MG tablet Take 10 mg by mouth daily.     No current facility-administered medications for this visit.     ALLERGIES: Rifampin and Sulfonamide derivatives  Family History  Problem Relation Age of Onset  . Hypertension  Mother   . Cancer Mother 33    Social History   Socioeconomic History  . Marital status: Single    Spouse name: Not on file  . Number of children: Not on file  . Years of education: Not on file  . Highest education level: Not on file  Occupational History  . Not on file  Tobacco Use  . Smoking status: Never Smoker  . Smokeless tobacco: Never Used  Substance and Sexual Activity  . Alcohol use: Yes    Comment: socially  . Drug use: No  . Sexual activity: Not Currently    Birth control/protection: None  Other Topics Concern  . Not on file  Social History Narrative  . Not on file   Social Determinants of Health   Financial Resource Strain:   . Difficulty of Paying Living Expenses: Not on file  Food Insecurity:   . Worried About Charity fundraiser in the Last Year: Not on file  . Ran Out of Food in the Last Year: Not on file  Transportation Needs:   . Lack of  Transportation (Medical): Not on file  . Lack of Transportation (Non-Medical): Not on file  Physical Activity:   . Days of Exercise per Week: Not on file  . Minutes of Exercise per Session: Not on file  Stress:   . Feeling of Stress : Not on file  Social Connections:   . Frequency of Communication with Friends and Family: Not on file  . Frequency of Social Gatherings with Friends and Family: Not on file  . Attends Religious Services: Not on file  . Active Member of Clubs or Organizations: Not on file  . Attends Archivist Meetings: Not on file  . Marital Status: Not on file  Intimate Partner Violence:   . Fear of Current or Ex-Partner: Not on file  . Emotionally Abused: Not on file  . Physically Abused: Not on file  . Sexually Abused: Not on file    Review of Systems  All other systems reviewed and are negative.   PHYSICAL EXAMINATION:    BP 110/72   Pulse (!) 103   Ht 5\' 1"  (1.549 m)   Wt 191 lb (86.6 kg)   LMP 04/08/2014   SpO2 98%   BMI 36.09 kg/m     General appearance: alert,  cooperative and appears stated age Neck: no adenopathy, supple, symmetrical, trachea midline and thyroid normal to inspection and palpation Heart: regular rate and rhythm Lungs: CTAB Abdomen: soft, non-tender; bowel sounds normal; no masses,  no organomegaly Extremities: normal, atraumatic, no cyanosis Skin: normal color, texture and turgor, no rashes or lesions Lymph: normal cervical supraclavicular and inguinal nodes Neurologic: grossly normal   Pelvic: External genitalia:  no lesions              Urethra:  normal appearing urethra with no masses, tenderness or lesions              Bartholins and Skenes: normal                 Vagina: cytotec tablets removed digitally  Chaperone was present for exam.  Reviewed ultrasound images with the patient.  ASSESSMENT PMP bleeding, ultrasound with thickened endometrium and suspected polyps.     PLAN Plan: hysteroscopy, polypectomy, dilation and curettage. Reviewed risks, including: bleeding, infection, uterine perforation, fluid overload, need for further surgery Will pre treat with cytotec   In addition to reviewing ultrasound images, over 15 minutes was spent in management and counseling

## 2020-07-15 NOTE — H&P (View-Only) (Signed)
GYNECOLOGY  VISIT   HPI: 54 y.o.   Single White or Caucasian Not Hispanic or Latino  female   G0P0000 with Patient's last menstrual period was 04/08/2014.   here for ultrasound consult for PMP bleeding. The patient started bleeding at the end of July and was still spotting at the time of her last visit on 06/30/20.  She pretreated with cytotec and is bleeding again today. No significant cramping.   GYNECOLOGIC HISTORY: Patient's last menstrual period was 04/08/2014. Contraception:PMP Menopausal hormone therapy: none         OB History    Gravida  0   Para  0   Term  0   Preterm  0   AB  0   Living  0     SAB  0   TAB  0   Ectopic  0   Multiple  0   Live Births  0              Patient Active Problem List   Diagnosis Date Noted  . SYNDROME, CARPAL TUNNEL 06/28/2007  . GERD 06/28/2007    Past Medical History:  Diagnosis Date  . Endometriosis   . Fibroid   . History of uterine fibroid   . Hyperlipidemia   . Hypertension     Past Surgical History:  Procedure Laterality Date  . breast biopsies Left 6/15   benign  . CARPAL TUNNEL RELEASE    . CERVICAL BIOPSY  W/ LOOP ELECTRODE EXCISION     she was in her mid 20's  . esophagus stretching    . LASER LAPAROSCOPY  1987   fulgurization of endometriosis, no bx    Current Outpatient Medications  Medication Sig Dispense Refill  . escitalopram (LEXAPRO) 10 MG tablet Take 10 mg by mouth daily.    Marland Kitchen ibuprofen (ADVIL) 800 MG tablet Take 1 tablet (800 mg total) by mouth every 8 (eight) hours as needed. 30 tablet 0  . losartan (COZAAR) 50 MG tablet Take 50 mg by mouth daily.    . Multiple Vitamins-Minerals (MULTIVITAMIN ADULT) TABS Take 1 tablet by mouth daily.    . rosuvastatin (CRESTOR) 10 MG tablet Take 10 mg by mouth daily.     No current facility-administered medications for this visit.     ALLERGIES: Rifampin and Sulfonamide derivatives  Family History  Problem Relation Age of Onset  . Hypertension  Mother   . Cancer Mother 69    Social History   Socioeconomic History  . Marital status: Single    Spouse name: Not on file  . Number of children: Not on file  . Years of education: Not on file  . Highest education level: Not on file  Occupational History  . Not on file  Tobacco Use  . Smoking status: Never Smoker  . Smokeless tobacco: Never Used  Substance and Sexual Activity  . Alcohol use: Yes    Comment: socially  . Drug use: No  . Sexual activity: Not Currently    Birth control/protection: None  Other Topics Concern  . Not on file  Social History Narrative  . Not on file   Social Determinants of Health   Financial Resource Strain:   . Difficulty of Paying Living Expenses: Not on file  Food Insecurity:   . Worried About Charity fundraiser in the Last Year: Not on file  . Ran Out of Food in the Last Year: Not on file  Transportation Needs:   . Lack of  Transportation (Medical): Not on file  . Lack of Transportation (Non-Medical): Not on file  Physical Activity:   . Days of Exercise per Week: Not on file  . Minutes of Exercise per Session: Not on file  Stress:   . Feeling of Stress : Not on file  Social Connections:   . Frequency of Communication with Friends and Family: Not on file  . Frequency of Social Gatherings with Friends and Family: Not on file  . Attends Religious Services: Not on file  . Active Member of Clubs or Organizations: Not on file  . Attends Archivist Meetings: Not on file  . Marital Status: Not on file  Intimate Partner Violence:   . Fear of Current or Ex-Partner: Not on file  . Emotionally Abused: Not on file  . Physically Abused: Not on file  . Sexually Abused: Not on file    Review of Systems  All other systems reviewed and are negative.   PHYSICAL EXAMINATION:    BP 110/72   Pulse (!) 103   Ht 5\' 1"  (1.549 m)   Wt 191 lb (86.6 kg)   LMP 04/08/2014   SpO2 98%   BMI 36.09 kg/m     General appearance: alert,  cooperative and appears stated age Neck: no adenopathy, supple, symmetrical, trachea midline and thyroid normal to inspection and palpation Heart: regular rate and rhythm Lungs: CTAB Abdomen: soft, non-tender; bowel sounds normal; no masses,  no organomegaly Extremities: normal, atraumatic, no cyanosis Skin: normal color, texture and turgor, no rashes or lesions Lymph: normal cervical supraclavicular and inguinal nodes Neurologic: grossly normal   Pelvic: External genitalia:  no lesions              Urethra:  normal appearing urethra with no masses, tenderness or lesions              Bartholins and Skenes: normal                 Vagina: cytotec tablets removed digitally  Chaperone was present for exam.  Reviewed ultrasound images with the patient.  ASSESSMENT PMP bleeding, ultrasound with thickened endometrium and suspected polyps.     PLAN Plan: hysteroscopy, polypectomy, dilation and curettage. Reviewed risks, including: bleeding, infection, uterine perforation, fluid overload, need for further surgery Will pre treat with cytotec   In addition to reviewing ultrasound images, over 15 minutes was spent in management and counseling

## 2020-07-17 ENCOUNTER — Other Ambulatory Visit: Payer: Self-pay | Admitting: Internal Medicine

## 2020-07-17 DIAGNOSIS — Z1231 Encounter for screening mammogram for malignant neoplasm of breast: Secondary | ICD-10-CM

## 2020-07-23 ENCOUNTER — Telehealth: Payer: Self-pay | Admitting: Obstetrics and Gynecology

## 2020-07-23 NOTE — Telephone Encounter (Signed)
Call to patient. Requests to proceed with surgery next Tuesday 9-14 but took Irving with [facility] this am.  Advised 07-29-20 is not available. Available date options reviewed and patient desires to proceed on 08-12-20.  Skeet Latch will call her back to confirm.

## 2020-07-23 NOTE — Telephone Encounter (Signed)
Spoke with patient regarding surgery benefits. Patient acknowledges understanding of information presented. Patient is aware that benefits presented are for professional benefits only. Patient is aware that once surgery is scheduled, the hospital will call with separate benefits. Patient is aware of surgery cancellation policy.  Patient is requesting surgery on 07/29/2020. Patient currently on Aspirin and asking how many days prior she needs to discontinue medication. Informed patient that nursing supervisor, Verline Lema, would call her back tomorrow to discuss surgery date options and preop medication management. Patient is agreeable.   Cc: Lamont Snowball

## 2020-07-24 NOTE — Telephone Encounter (Signed)
Spoke with patient. COVID test scheduled for 08/08/2020 at 10:15 am at [facility] location. Patient is aware of the need to quarantine after test until surgery. 2 week post op scheduled for 08/25/2020 at 10 am with Dr.Jertson. Patient is agreeable to date and time. Surgery instructions reviewed and mailed to patient's verified home address on file.  Routing to provider and will close encounter.

## 2020-07-25 ENCOUNTER — Telehealth: Payer: Self-pay | Admitting: Obstetrics and Gynecology

## 2020-07-25 NOTE — Telephone Encounter (Signed)
Left message to call Hana Trippett, RN at [facility] [phone].   

## 2020-07-25 NOTE — Telephone Encounter (Signed)
Patient returned a call to Jill.   

## 2020-07-25 NOTE — Telephone Encounter (Signed)
Patient has some questions about taking supplements before her surgery.

## 2020-07-25 NOTE — Telephone Encounter (Signed)
Spoke with patient. Patient is scheduled for surgery on 08/12/20 at [facility]. She has recently started Coenzyme Q10, asking when she should stop prior to surgery? Advised to stop 7 herbal supplements 7 days prior to surgery. Also advised patient that [facility] pre-op nurse will be contacting her prior to surgery, they will review daily meds and provide further instructions. Questions answered.   Encounter closed.

## 2020-08-07 ENCOUNTER — Ambulatory Visit
Admission: RE | Admit: 2020-08-07 | Discharge: 2020-08-07 | Disposition: A | Discharge status: Home / Self Care | Payer: 59 | Source: Ambulatory Visit | Attending: Internal Medicine | Admitting: Internal Medicine

## 2020-08-07 ENCOUNTER — Other Ambulatory Visit: Payer: Self-pay

## 2020-08-07 DIAGNOSIS — Z1231 Encounter for screening mammogram for malignant neoplasm of breast: Secondary | ICD-10-CM

## 2020-08-08 ENCOUNTER — Other Ambulatory Visit (HOSPITAL_COMMUNITY)
Admission: RE | Admit: 2020-08-08 | Discharge: 2020-08-08 | Disposition: A | Discharge status: Home / Self Care | Payer: 59 | Source: Ambulatory Visit | Attending: Obstetrics and Gynecology | Admitting: Obstetrics and Gynecology

## 2020-08-08 ENCOUNTER — Other Ambulatory Visit: Payer: Self-pay

## 2020-08-08 ENCOUNTER — Encounter (HOSPITAL_BASED_OUTPATIENT_CLINIC_OR_DEPARTMENT_OTHER): Payer: Self-pay | Admitting: Obstetrics and Gynecology

## 2020-08-08 DIAGNOSIS — Z20822 Contact with and (suspected) exposure to covid-19: Secondary | ICD-10-CM | POA: Insufficient documentation

## 2020-08-08 DIAGNOSIS — Z01812 Encounter for preprocedural laboratory examination: Secondary | ICD-10-CM | POA: Insufficient documentation

## 2020-08-08 LAB — SARS CORONAVIRUS 2 (TAT 6-24 HRS): SARS Coronavirus 2: NEGATIVE

## 2020-08-08 NOTE — Progress Notes (Addendum)
Spoke w/ via phone for pre-op interview---pt Lab needs dos----  I stat 8, cbc with dif, cmet urine culture dr Shelia Media done 08-08-2020 received and placed on pt chart           Lab results------ COVID test ------07-08-20 1015 Arrive at -------630 am 08-12-20 NPO after MN NO Solid Food.  Clear liquids from MN until---530 am then npo Medications to take morning of surgery -----lexapro prn Diabetic medication -----n/a Patient Special Instructions -----none Pre-Op special Istructions -----none Patient verbalized understanding of instructions that were given at this phone interview. Patient denies shortness of breath, chest pain, fever, cough at this phone interview.  Requested labs done 08-08-2020 dr Shelia Media

## 2020-08-11 ENCOUNTER — Telehealth: Payer: Self-pay

## 2020-08-11 NOTE — Telephone Encounter (Signed)
Per review of Epic, patient is scheduled for D&C on 08/12/20. Thickened endometrial lining, suspected endometrial polyps. Planning pre-treat with Cytotec.  Cytotec Rx sent to [facility] on 07/15/20. Call placed to [facility], spoke with [facility]. Confirmed Rx on file, will fill today.    Call returned to patient. Advised as seen above. Reviewed Cytotec instructions. Patient will f/u with pharmacy for filling.   Patient states she has an area just to the side of umbilicus that she recently had biopsied by PCP, reports "negative for cancer". Patient reports the biopsy site is scabbed and healing, reports some redness around the scab. Denies drainage, warmth, pain or itching. Has been applying triple abx ointment. Asking if there is any concerns regarding surgery? Advised if no fever/chills or worsening symptoms, ok to proceed as scheduled. Recommended f/u with PCP if symptoms do not improve or resolve. Will update Dr. Talbert Nan. Patient agreeable.   Routing to provider for final review.

## 2020-08-11 NOTE — Telephone Encounter (Signed)
Agree, thank you

## 2020-08-11 NOTE — Telephone Encounter (Signed)
Patient said she was getting a prescription sent to pharmacy before procedure tomorrow and it was not there.

## 2020-08-11 NOTE — Telephone Encounter (Signed)
Encounter closed

## 2020-08-12 ENCOUNTER — Encounter (HOSPITAL_BASED_OUTPATIENT_CLINIC_OR_DEPARTMENT_OTHER): Payer: Self-pay | Admitting: Obstetrics and Gynecology

## 2020-08-12 ENCOUNTER — Ambulatory Visit (HOSPITAL_BASED_OUTPATIENT_CLINIC_OR_DEPARTMENT_OTHER): Payer: 59 | Admitting: Certified Registered Nurse Anesthetist

## 2020-08-12 ENCOUNTER — Ambulatory Visit (HOSPITAL_BASED_OUTPATIENT_CLINIC_OR_DEPARTMENT_OTHER)
Admission: RE | Admit: 2020-08-12 | Discharge: 2020-08-12 | Disposition: A | Discharge status: Home / Self Care | Payer: 59 | Attending: Obstetrics and Gynecology | Admitting: Obstetrics and Gynecology

## 2020-08-12 ENCOUNTER — Encounter (HOSPITAL_BASED_OUTPATIENT_CLINIC_OR_DEPARTMENT_OTHER)
Admission: RE | Disposition: A | Discharge status: Home / Self Care | Payer: Self-pay | Source: Home / Self Care | Attending: Obstetrics and Gynecology

## 2020-08-12 DIAGNOSIS — N8501 Benign endometrial hyperplasia: Secondary | ICD-10-CM

## 2020-08-12 DIAGNOSIS — Z8249 Family history of ischemic heart disease and other diseases of the circulatory system: Secondary | ICD-10-CM | POA: Insufficient documentation

## 2020-08-12 DIAGNOSIS — Z79899 Other long term (current) drug therapy: Secondary | ICD-10-CM | POA: Diagnosis not present

## 2020-08-12 DIAGNOSIS — I10 Essential (primary) hypertension: Secondary | ICD-10-CM | POA: Insufficient documentation

## 2020-08-12 DIAGNOSIS — R9389 Abnormal findings on diagnostic imaging of other specified body structures: Secondary | ICD-10-CM | POA: Diagnosis present

## 2020-08-12 DIAGNOSIS — Z882 Allergy status to sulfonamides status: Secondary | ICD-10-CM | POA: Diagnosis not present

## 2020-08-12 DIAGNOSIS — N840 Polyp of corpus uteri: Secondary | ICD-10-CM | POA: Insufficient documentation

## 2020-08-12 DIAGNOSIS — E785 Hyperlipidemia, unspecified: Secondary | ICD-10-CM | POA: Diagnosis not present

## 2020-08-12 DIAGNOSIS — Z888 Allergy status to other drugs, medicaments and biological substances status: Secondary | ICD-10-CM | POA: Insufficient documentation

## 2020-08-12 DIAGNOSIS — N950 Postmenopausal bleeding: Secondary | ICD-10-CM | POA: Diagnosis not present

## 2020-08-12 DIAGNOSIS — D259 Leiomyoma of uterus, unspecified: Secondary | ICD-10-CM | POA: Diagnosis not present

## 2020-08-12 HISTORY — DX: Postmenopausal bleeding: N95.0

## 2020-08-12 HISTORY — PX: DILATATION & CURETTAGE/HYSTEROSCOPY WITH MYOSURE: SHX6511

## 2020-08-12 LAB — POCT I-STAT, CHEM 8
BUN: 17 mg/dL (ref 6–20)
Calcium, Ion: 1.17 mmol/L (ref 1.15–1.40)
Chloride: 102 mmol/L (ref 98–111)
Creatinine, Ser: 0.6 mg/dL (ref 0.44–1.00)
Glucose, Bld: 200 mg/dL — ABNORMAL HIGH (ref 70–99)
HCT: 45 % (ref 36.0–46.0)
Hemoglobin: 15.3 g/dL — ABNORMAL HIGH (ref 12.0–15.0)
Potassium: 4.4 mmol/L (ref 3.5–5.1)
Sodium: 138 mmol/L (ref 135–145)
TCO2: 25 mmol/L (ref 22–32)

## 2020-08-12 SURGERY — DILATATION & CURETTAGE/HYSTEROSCOPY WITH MYOSURE
Anesthesia: General | Site: Vagina

## 2020-08-12 MED ORDER — DEXAMETHASONE SODIUM PHOSPHATE 10 MG/ML IJ SOLN
INTRAMUSCULAR | Status: DC | PRN
  Administered 2020-08-12: 10 mg via INTRAVENOUS

## 2020-08-12 MED ORDER — ACETAMINOPHEN 500 MG PO TABS
ORAL_TABLET | ORAL | Status: AC
  Filled 2020-08-12: qty 2

## 2020-08-12 MED ORDER — EPHEDRINE SULFATE-NACL 50-0.9 MG/10ML-% IV SOSY
PREFILLED_SYRINGE | INTRAVENOUS | Status: DC | PRN
  Administered 2020-08-12: 10 mg via INTRAVENOUS

## 2020-08-12 MED ORDER — ACETAMINOPHEN 325 MG PO TABS
ORAL_TABLET | ORAL | Status: DC | PRN
  Administered 2020-08-12: 1000 mg via ORAL

## 2020-08-12 MED ORDER — SCOPOLAMINE 1 MG/3DAYS TD PT72
MEDICATED_PATCH | TRANSDERMAL | Status: AC
  Filled 2020-08-12: qty 1

## 2020-08-12 MED ORDER — PROPOFOL 10 MG/ML IV BOLUS
INTRAVENOUS | Status: DC | PRN
  Administered 2020-08-12: 150 mg via INTRAVENOUS

## 2020-08-12 MED ORDER — ONDANSETRON HCL 4 MG/2ML IJ SOLN
INTRAMUSCULAR | Status: AC
  Filled 2020-08-12: qty 2

## 2020-08-12 MED ORDER — PROPOFOL 10 MG/ML IV BOLUS
INTRAVENOUS | Status: AC
  Filled 2020-08-12: qty 20

## 2020-08-12 MED ORDER — OXYCODONE HCL 5 MG/5ML PO SOLN: 5.0000 mg | Freq: Once | ORAL | Status: DC | PRN

## 2020-08-12 MED ORDER — ONDANSETRON HCL 4 MG/2ML IJ SOLN
4.0000 mg | Freq: Once | INTRAMUSCULAR | Status: AC | PRN
  Administered 2020-08-12: 4 mg via INTRAVENOUS

## 2020-08-12 MED ORDER — FENTANYL CITRATE (PF) 100 MCG/2ML IJ SOLN
INTRAMUSCULAR | Status: AC
  Filled 2020-08-12: qty 2

## 2020-08-12 MED ORDER — POVIDONE-IODINE 10 % EX SWAB: 2.0000 "application " | Freq: Once | CUTANEOUS | Status: DC

## 2020-08-12 MED ORDER — LIDOCAINE 2% (20 MG/ML) 5 ML SYRINGE
INTRAMUSCULAR | Status: AC
  Filled 2020-08-12: qty 5

## 2020-08-12 MED ORDER — FENTANYL CITRATE (PF) 100 MCG/2ML IJ SOLN: 25.0000 ug | INTRAMUSCULAR | Status: DC | PRN

## 2020-08-12 MED ORDER — MIDAZOLAM HCL 5 MG/5ML IJ SOLN
INTRAMUSCULAR | Status: DC | PRN
  Administered 2020-08-12: 2 mg via INTRAVENOUS

## 2020-08-12 MED ORDER — SCOPOLAMINE 1 MG/3DAYS TD PT72
MEDICATED_PATCH | TRANSDERMAL | Status: DC | PRN
  Administered 2020-08-12: 1 via TRANSDERMAL

## 2020-08-12 MED ORDER — DEXAMETHASONE SODIUM PHOSPHATE 10 MG/ML IJ SOLN
INTRAMUSCULAR | Status: AC
  Filled 2020-08-12: qty 1

## 2020-08-12 MED ORDER — FENTANYL CITRATE (PF) 100 MCG/2ML IJ SOLN
INTRAMUSCULAR | Status: DC | PRN
Start: 2020-08-12 — End: 2020-08-12
  Administered 2020-08-12 (×3): 50 ug via INTRAVENOUS

## 2020-08-12 MED ORDER — ONDANSETRON HCL 4 MG/2ML IJ SOLN
INTRAMUSCULAR | Status: DC | PRN
  Administered 2020-08-12: 4 mg via INTRAVENOUS

## 2020-08-12 MED ORDER — OXYCODONE HCL 5 MG PO TABS: 5.0000 mg | ORAL_TABLET | Freq: Once | ORAL | Status: DC | PRN

## 2020-08-12 MED ORDER — LIDOCAINE 2% (20 MG/ML) 5 ML SYRINGE
INTRAMUSCULAR | Status: DC | PRN
  Administered 2020-08-12: 100 mg via INTRAVENOUS

## 2020-08-12 MED ORDER — ACETAMINOPHEN 160 MG/5ML PO SOLN: 325.0000 mg | ORAL | Status: DC | PRN

## 2020-08-12 MED ORDER — EPHEDRINE 5 MG/ML INJ
INTRAVENOUS | Status: AC
  Filled 2020-08-12: qty 10

## 2020-08-12 MED ORDER — FENTANYL CITRATE (PF) 100 MCG/2ML IJ SOLN
INTRAMUSCULAR | Status: AC
Start: 2020-08-12 — End: ?
  Filled 2020-08-12: qty 2

## 2020-08-12 MED ORDER — KETOROLAC TROMETHAMINE 30 MG/ML IJ SOLN
INTRAMUSCULAR | Status: DC | PRN
  Administered 2020-08-12: 30 mg via INTRAVENOUS

## 2020-08-12 MED ORDER — MEPERIDINE HCL 25 MG/ML IJ SOLN: 6.2500 mg | INTRAMUSCULAR | Status: DC | PRN

## 2020-08-12 MED ORDER — FERRIC SUBSULFATE 259 MG/GM EX SOLN
CUTANEOUS | Status: DC | PRN
  Administered 2020-08-12: 1

## 2020-08-12 MED ORDER — ACETAMINOPHEN 325 MG PO TABS: 325.0000 mg | ORAL_TABLET | ORAL | Status: DC | PRN

## 2020-08-12 MED ORDER — LACTATED RINGERS IV SOLN: INTRAVENOUS | Status: DC

## 2020-08-12 MED ORDER — MIDAZOLAM HCL 2 MG/2ML IJ SOLN
INTRAMUSCULAR | Status: AC
  Filled 2020-08-12: qty 2

## 2020-08-12 MED ORDER — KETOROLAC TROMETHAMINE 30 MG/ML IJ SOLN: 30.0000 mg | Freq: Once | INTRAMUSCULAR | Status: DC | PRN

## 2020-08-12 SURGICAL SUPPLY — 25 items
BLADE EXTENDED COATED 6.5IN (ELECTRODE) ×1 IMPLANT
CATH ROBINSON RED A/P 16FR (CATHETERS) IMPLANT
COVER WAND RF STERILE (DRAPES) ×2 IMPLANT
DEVICE MYOSURE LITE (MISCELLANEOUS) IMPLANT
DEVICE MYOSURE REACH (MISCELLANEOUS) ×1 IMPLANT
DILATOR CANAL MILEX (MISCELLANEOUS) ×1 IMPLANT
GAUZE 4X4 16PLY RFD (DISPOSABLE) ×2 IMPLANT
GLOVE BIO SURGEON STRL SZ 6.5 (GLOVE) ×2 IMPLANT
GOWN STRL REUS W/TWL LRG LVL3 (GOWN DISPOSABLE) ×2 IMPLANT
IV NS IRRIG 3000ML ARTHROMATIC (IV SOLUTION) ×2 IMPLANT
KIT PROCEDURE FLUENT (KITS) ×2 IMPLANT
KIT TURNOVER CYSTO (KITS) ×2 IMPLANT
MYOSURE XL FIBROID (MISCELLANEOUS)
PACK VAGINAL MINOR WOMEN LF (CUSTOM PROCEDURE TRAY) ×2 IMPLANT
PAD OB MATERNITY 4.3X12.25 (PERSONAL CARE ITEMS) ×2 IMPLANT
PAD PREP 24X48 CUFFED NSTRL (MISCELLANEOUS) ×2 IMPLANT
PENCIL BUTTON HOLSTER BLD 10FT (ELECTRODE) ×1 IMPLANT
SCOPETTES 8  STERILE (MISCELLANEOUS) ×2
SCOPETTES 8 STERILE (MISCELLANEOUS) IMPLANT
SEAL CERVICAL OMNI LOK (ABLATOR) IMPLANT
SEAL ROD LENS SCOPE MYOSURE (ABLATOR) ×2 IMPLANT
SYR 20ML LL LF (SYRINGE) IMPLANT
SYSTEM TISS REMOVAL MYOSURE XL (MISCELLANEOUS) IMPLANT
TOWEL OR 17X26 10 PK STRL BLUE (TOWEL DISPOSABLE) ×2 IMPLANT
WATER STERILE IRR 500ML POUR (IV SOLUTION) IMPLANT

## 2020-08-12 NOTE — Op Note (Signed)
Preoperative Diagnosis: Postmenopausal bleeding, thickened endometrium, fibroid uterus  Postoperative Diagnosis: Postmenopausal bleeding, thickened endometrium, fibroid uterus, endometrial polyps  Procedure: Hysteroscopy, polypectomy, dilation and curettage  Surgeon: Dr Sumner Boast  Assistants: None  Anesthesia: General via LMA  EBL: 10 cc  Fluids: 700 cc  Fluid deficit: 200 cc  Urine output: not recorded  Indications for surgery: The patient is a 54 yo female, who presented with Postmenopausal bleeding. Work up included a normal pap smear and an ultrasound with a thickened endometrial stripe with suspected endometrial polyps.  The risks of the surgery were reviewed with the patient and the consent form was signed prior to her surgery. Of note her blood work the morning of surgery returned with a glucose of 200. The patient was instructed to follow up with her primary MD for evaluation and management of diabetes.   Findings: EUA: irregular, enlarged uterus, no adnexal masses. Hysteroscopy: 3 endometrial polyps, slightly thickened endometrium posteriorly, cavity deviated slightly my myoma on the right uterine wall under the tubal ostia. Normal tubal ostia bilaterally.  Specimens: endometrial polyps, endometrial curettings.    Procedure: The patient was taken to the operating room with an IV in place. She was placed in the dorsal lithotomy position and anesthesia was administered. She was prepped and draped in the usual sterile fashion for a vaginal procedure. She voided on the way to the OR. Her introitus is very narrow and 2 deaver retractors were used to visualize the cervix. A single tooth tenaculum was placed on the anterior lip of the cervix. The cervix was dilated to a #7 hagar dilator. The uterus was sounded to 9-10 cm. The myosure hysteroscope was inserted into the uterine cavity. With continuous infusion of normal saline, the uterine cavity was visualized with the above findings.  The myosure reach was used to resect the polyps. The tenaculum pulled off of the cervix during the resection. The myosure was then removed and the tenaculum was replaced. The cavity was then curetted with the small sharp curette. The cavity had the characteristically gritty texture at the end of the procedure. The curette and the single tooth tenaculum were removed. A long pederson speculum was used to visualize the cervix. The laceration from the tenaculum site was bleeding. This was stopped with cautery, then monsels was applied. Hemostasis was excellent. The speculum was removed. The patients perineum was cleansed of betadine and she was taken out of the dorsal lithotomy position.  Upon awakening the LMA was removed and the patient was transferred to the recovery room in stable and awake condition.  The sponge and instrument count were correct. There were no complications.    CC: Dr Deland Pretty

## 2020-08-12 NOTE — Anesthesia Procedure Notes (Signed)
Procedure Name: LMA Insertion Date/Time: 08/12/2020 8:46 AM Performed by: Rogers Blocker, CRNA Pre-anesthesia Checklist: Patient identified, Emergency Drugs available, Suction available and Patient being monitored Patient Re-evaluated:Patient Re-evaluated prior to induction Oxygen Delivery Method: Circle System Utilized Preoxygenation: Pre-oxygenation with 100% oxygen Induction Type: IV induction Ventilation: Mask ventilation without difficulty LMA: LMA inserted LMA Size: 4.0 Number of attempts: 1 Airway Equipment and Method: Bite block Placement Confirmation: positive ETCO2 Tube secured with: Tape Dental Injury: Teeth and Oropharynx as per pre-operative assessment

## 2020-08-12 NOTE — Discharge Instructions (Signed)

## 2020-08-12 NOTE — Interval H&P Note (Signed)
History and Physical Interval Note:  08/12/2020 8:20 AM  Colleen Cantrell  has presented today for surgery, with the diagnosis of PMB, endometrial polyp.  The various methods of treatment have been discussed with the patient and family. After consideration of risks, benefits and other options for treatment, the patient has consented to  Procedure(s) with comments: [place] (N/A) - Thickened endometrial lining, suspected endometrial polyps. Planning pre-treat with Cytotec. as a surgical intervention.  The patient's history has been reviewed, patient examined, no change in status, stable for surgery.  I have reviewed the patient's chart and labs.  Questions were answered to the patient's satisfaction.    The patient's am labs returned with an abnormal glucose level of 200. The patient reports significant weight gain in the last year. In 2018 she was seen for a nutrition consultation for prediabetes. She will f/u with her primary for evaluation and treatment of diabetes.    Salvadore Dom

## 2020-08-12 NOTE — Transfer of Care (Signed)
Immediate Anesthesia Transfer of Care Note  Patient: Colleen Cantrell  Procedure(s) Performed: DILATATION & CURETTAGE/HYSTEROSCOPY WITH MYOSURE (N/A Vagina )  Patient Location: PACU  Anesthesia Type:General  Level of Consciousness: awake, alert , oriented and patient cooperative  Airway & Oxygen Therapy: Patient Spontanous Breathing and Patient connected to nasal cannula oxygen  Post-op Assessment: Report given to RN and Post -op Vital signs reviewed and stable  Post vital signs: Reviewed and stable  Last Vitals:  Vitals Value Taken Time  BP 145/91 08/12/20 0930  Temp 36.5 C 08/12/20 0930  Pulse 106 08/12/20 0930  Resp 10 08/12/20 0930  SpO2 94 % 08/12/20 0930    Last Pain:  Vitals:   08/12/20 0702  TempSrc: Oral  PainSc: 0-No pain      Patients Stated Pain Goal: 3 (70/14/10 3013)  Complications: No complications documented.

## 2020-08-12 NOTE — Anesthesia Preprocedure Evaluation (Addendum)
Anesthesia Evaluation  Patient identified by MRN, date of birth, ID band Patient awake    Reviewed: Allergy & Precautions, Patient's Chart, lab work & pertinent test results  Airway Mallampati: II       Dental no notable dental hx.    Pulmonary neg pulmonary ROS,    Pulmonary exam normal        Cardiovascular hypertension, Pt. on medications Normal cardiovascular exam     Neuro/Psych negative psych ROS   GI/Hepatic Neg liver ROS,   Endo/Other  negative endocrine ROS  Renal/GU negative Renal ROS  negative genitourinary   Musculoskeletal negative musculoskeletal ROS (+)   Abdominal (+) + obese,   Peds  Hematology negative hematology ROS (+)   Anesthesia Other Findings   Reproductive/Obstetrics                             Anesthesia Physical Anesthesia Plan  ASA: II  Anesthesia Plan: General   Post-op Pain Management:    Induction:   PONV Risk Score and Plan: 4 or greater and Ondansetron, Dexamethasone and Midazolam  Airway Management Planned: LMA  Additional Equipment: None  Intra-op Plan:   Post-operative Plan:   Informed Consent: I have reviewed the patients History and Physical, chart, labs and discussed the procedure including the risks, benefits and alternatives for the proposed anesthesia with the patient or authorized representative who has indicated his/her understanding and acceptance.     Dental advisory given  Plan Discussed with: CRNA  Anesthesia Plan Comments:        Anesthesia Quick Evaluation

## 2020-08-13 ENCOUNTER — Encounter (HOSPITAL_BASED_OUTPATIENT_CLINIC_OR_DEPARTMENT_OTHER): Payer: Self-pay | Admitting: Obstetrics and Gynecology

## 2020-08-13 LAB — SURGICAL PATHOLOGY

## 2020-08-13 NOTE — Anesthesia Postprocedure Evaluation (Signed)
Anesthesia Post Note  Patient: Colleen Cantrell  Procedure(s) Performed: DILATATION & CURETTAGE/HYSTEROSCOPY WITH MYOSURE (N/A Vagina )     Patient location during evaluation: PACU Anesthesia Type: General Level of consciousness: awake Pain management: pain level controlled Vital Signs Assessment: post-procedure vital signs reviewed and stable Respiratory status: spontaneous breathing Cardiovascular status: stable Anesthetic complications: yes (N/V)   No complications documented.  Last Vitals:  Vitals:   08/12/20 1015 08/12/20 1110  BP: (!) 142/68 (!) 118/56  Pulse: 89 93  Resp: 11 17  Temp: 36.4 C   SpO2: 96% 98%    Last Pain:  Vitals:   08/12/20 1110  TempSrc:   PainSc: 0-No pain   Pain Goal: Patients Stated Pain Goal: 3 (08/12/20 0702)                 Huston Foley

## 2020-08-25 ENCOUNTER — Ambulatory Visit (INDEPENDENT_AMBULATORY_CARE_PROVIDER_SITE_OTHER): Payer: 59 | Admitting: Obstetrics and Gynecology

## 2020-08-25 ENCOUNTER — Encounter: Payer: Self-pay | Admitting: Obstetrics and Gynecology

## 2020-08-25 ENCOUNTER — Other Ambulatory Visit: Payer: Self-pay

## 2020-08-25 ENCOUNTER — Telehealth: Payer: Self-pay

## 2020-08-25 VITALS — BP 132/74 | HR 81 | Ht 61.0 in | Wt 186.6 lb

## 2020-08-25 DIAGNOSIS — Z9889 Other specified postprocedural states: Secondary | ICD-10-CM | POA: Diagnosis not present

## 2020-08-25 DIAGNOSIS — N3090 Cystitis, unspecified without hematuria: Secondary | ICD-10-CM

## 2020-08-25 LAB — POCT URINALYSIS DIPSTICK
Bilirubin, UA: NEGATIVE
Glucose, UA: POSITIVE — AB
Nitrite, UA: NEGATIVE
Protein, UA: NEGATIVE
Urobilinogen, UA: NEGATIVE E.U./dL — AB
pH, UA: 6 (ref 5.0–8.0)

## 2020-08-25 MED ORDER — PHENAZOPYRIDINE HCL 200 MG PO TABS: 200.0000 mg | ORAL_TABLET | Freq: Three times a day (TID) | ORAL | 0 refills | Status: DC | PRN

## 2020-08-25 MED ORDER — NITROFURANTOIN MONOHYD MACRO 100 MG PO CAPS: 100.0000 mg | ORAL_CAPSULE | Freq: Two times a day (BID) | ORAL | 0 refills | Status: DC

## 2020-08-25 NOTE — Patient Instructions (Signed)

## 2020-08-25 NOTE — Telephone Encounter (Signed)
NOTES ON FILE FROM [facility] [phone], SENT REFERRAL TO SCHEDULING 

## 2020-08-25 NOTE — Progress Notes (Signed)
GYNECOLOGY  VISIT   HPI: 54 y.o.   Single White or Caucasian Not Hispanic or Latino  female   [id number] with Patient's last menstrual period was 04/08/2014.   here for 2 week post op following hysteroscopy, polypectomy and D&C for PMP bleeding. Pathology was benign.   Her preop lab work showed a fasting glucose of 200. She is being evaluated by her primary. He started her on metformin. Was told to eat low carbs.   She thinks she has a UTI. Symptoms started in the last few days. She c/o urinary frequency, urgency to void, voiding small amounts. Bladder feels "warm", no dysuria. No fevers, no flank pain.   GYNECOLOGIC HISTORY: Patient's last menstrual period was 04/08/2014. Contraception:PMP Menopausal hormone therapy: none        OB History    Gravida  0   Para  0   Term  0   Preterm  0   AB  0   Living  0     SAB  0   TAB  0   Ectopic  0   Multiple  0   Live Births  0              Patient Active Problem List   Diagnosis Date Noted  . SYNDROME, CARPAL TUNNEL 06/28/2007  . GERD 06/28/2007    Past Medical History:  Diagnosis Date  . Endometriosis   . Fibroid   . History of uterine fibroid   . Hyperlipidemia   . Hypertension   . PMB (postmenopausal bleeding)     Past Surgical History:  Procedure Laterality Date  . breast biopsies Left 6/15   benign  . CARPAL TUNNEL RELEASE Bilateral yrs ago  . CERVICAL BIOPSY  W/ LOOP ELECTRODE EXCISION     she was in her mid 20's leep  . DILATATION & CURETTAGE/HYSTEROSCOPY WITH MYOSURE N/A 08/12/2020   Procedure: DILATATION & CURETTAGE/HYSTEROSCOPY WITH MYOSURE;  Surgeon: Salvadore Dom, MD;  Location: [facility];  Service: Gynecology;  Laterality: N/A;  Thickened endometrial lining, suspected endometrial polyps. Planning pre-treat with Cytotec.  Marland Kitchen esophagus stretching  yrs ago  . LASER LAPAROSCOPY  1987   fulgurization of endometriosis, no bx  . 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 escitalopram (LEXAPRO) 10 MG tablet Take 10 mg by mouth as needed.     Marland Kitchen ibuprofen (ADVIL) 800 MG tablet Take 1 tablet (800 mg total) by mouth every 8 (eight) hours as needed. 30 tablet 0  . losartan (COZAAR) 50 MG tablet Take 50 mg by mouth every evening.     . Melatonin 5 MG CAPS Take by mouth at bedtime as needed.    . Multiple Vitamins-Minerals (MULTIVITAMIN ADULT) TABS Take 1 tablet by mouth daily.    . rosuvastatin (CRESTOR) 10 MG tablet Take 10 mg by mouth every evening.      No current facility-administered medications for this visit.     ALLERGIES: Rifampin and Sulfonamide derivatives  Family History  Problem Relation Age of Onset  . Hypertension Mother   . Cancer Mother 54    Social History   Socioeconomic History  . Marital status: Single    Spouse name: Not on file  . Number of children: Not on file  . Years of education: Not on file  . Highest education level: Not on file  Occupational History  . Not  on file  Tobacco Use  . Smoking status: Never Smoker  . Smokeless tobacco: Never Used  Substance and Sexual Activity  . Alcohol use: Not Currently  . Drug use: No  . Sexual activity: Not Currently    Birth control/protection: None  Other Topics Concern  . Not on file  Social History Narrative  . Not on file   Social Determinants of Health   Financial Resource Strain:   . Difficulty of Paying Living Expenses: Not on file  Food Insecurity:   . Worried About Charity fundraiser in the Last Year: Not on file  . Ran Out of Food in the Last Year: Not on file  Transportation Needs:   . Lack of Transportation (Medical): Not on file  . Lack of Transportation (Non-Medical): Not on file  Physical Activity:   . Days of Exercise per Week: Not on file  . Minutes of Exercise per Session: Not on file  Stress:   . Feeling of Stress : Not on file  Social Connections:   . Frequency of  Communication with Friends and Family: Not on file  . Frequency of Social Gatherings with Friends and Family: Not on file  . Attends Religious Services: Not on file  . Active Member of Clubs or Organizations: Not on file  . Attends Archivist Meetings: Not on file  . Marital Status: Not on file  Intimate Partner Violence:   . Fear of Current or Ex-Partner: Not on file  . Emotionally Abused: Not on file  . Physically Abused: Not on file  . Sexually Abused: Not on file    Review of Systems  Genitourinary: Positive for frequency and urgency.  All other systems reviewed and are negative.   PHYSICAL EXAMINATION:    LMP 04/08/2014     General appearance: alert, cooperative and appears stated age Abdomen: soft, non-tender; non distended, no masses,  no organomegaly CVA: not tender   ASSESSMENT Cystitis H/O hysteroscopy for PMP bleeding, benign endometrial polyps removed    PLAN Send urine for ua, c&s Treat with macrobid and pyridium Routine f/u

## 2020-08-26 LAB — URINALYSIS, MICROSCOPIC ONLY
Bacteria, UA: NONE SEEN
Casts: NONE SEEN /lpf
WBC, UA: NONE SEEN /hpf (ref 0–5)

## 2020-08-27 LAB — URINE CULTURE

## 2020-09-08 ENCOUNTER — Other Ambulatory Visit: Payer: Self-pay

## 2020-09-08 ENCOUNTER — Encounter: Payer: Self-pay | Admitting: Interventional Cardiology

## 2020-09-08 ENCOUNTER — Ambulatory Visit (INDEPENDENT_AMBULATORY_CARE_PROVIDER_SITE_OTHER): Payer: 59 | Admitting: Interventional Cardiology

## 2020-09-08 VITALS — BP 144/88 | HR 86 | Ht 61.0 in | Wt 182.8 lb

## 2020-09-08 DIAGNOSIS — I10 Essential (primary) hypertension: Secondary | ICD-10-CM | POA: Diagnosis not present

## 2020-09-08 DIAGNOSIS — R9431 Abnormal electrocardiogram [ECG] [EKG]: Secondary | ICD-10-CM

## 2020-09-08 DIAGNOSIS — I447 Left bundle-branch block, unspecified: Secondary | ICD-10-CM

## 2020-09-08 DIAGNOSIS — E119 Type 2 diabetes mellitus without complications: Secondary | ICD-10-CM | POA: Diagnosis not present

## 2020-09-08 MED ORDER — LOSARTAN POTASSIUM 50 MG PO TABS
100.0000 mg | ORAL_TABLET | Freq: Every evening | ORAL | 3 refills | Status: DC
Start: 2020-09-08 — End: 2022-07-13

## 2020-09-08 NOTE — Progress Notes (Signed)
Cardiology Office Note   Date:  09/08/2020   ID:  Colleen Cantrell, DOB [birth year], MRN [id number]  PCP:  Deland Pretty, MD    No chief complaint on file.  Abnormal ECG  Wt Readings from Last 3 Encounters:  09/08/20 182 lb 12.8 oz (82.9 kg)  08/25/20 186 lb 9.6 oz (84.6 kg)  08/12/20 188 lb 8 oz (85.5 kg)       History of Present Illness: Colleen Cantrell is a 54 y.o. female who is being seen today for the evaluation of abnormal ECG at the request of Deland Pretty, MD.  ECG on 08/12/20 showed NSR with LBBB.  No change from 4/20 ECG. ECG was done for preop before uterine polyp surgery.  Also DM diagnosed with DM.  Had been borderline for many years.  A1C 8.3 at last check.    Denies : exertional Chest pain. Dizziness. Leg edema. Nitroglycerin use. Orthopnea. Palpitations. Paroxysmal nocturnal dyspnea. Shortness of breath. Syncope.   Does feel some indigestion for seconds at a time.  Not related to what she eats.   Walks 1-3 miles/day.  Usually feels ok with that.  Limited by foot pain.    Both parents had heart problems in their 35s.   She never smoked.   Past Medical History:  Diagnosis Date   Acne    Allergy to IVP dye    Axillary hidradenitis suppurativa    Endometriosis    Family history of colon cancer    Family history of heart disease    Fatigue    Fibroid    Hand pain    Hematuria    Hidradenitis    History of abnormal cervical Pap smear    History of uterine fibroid    Hypercholesterolemia    Hyperlipidemia    Hypertension    Hypertriglyceridemia    Iron deficiency anemia    Obese    PMB (postmenopausal bleeding)    Polyarthralgia    Prediabetes    Shift work sleep disorder    Swimmer's ear    Trochanteric bursitis     Past Surgical History:  Procedure Laterality Date   breast biopsies Left 6/15   benign   CARPAL TUNNEL RELEASE Bilateral yrs ago   CERVICAL BIOPSY  W/ LOOP ELECTRODE EXCISION     she was in her  mid 20's leep   [place] N/A 08/12/2020   Procedure: [place];  Surgeon: Salvadore Dom, MD;  Location: [facility];  Service: Gynecology;  Laterality: N/A;  Thickened endometrial lining, suspected endometrial polyps. Planning pre-treat with Cytotec.   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      ibuprofen (ADVIL) 800 MG tablet Take 1 tablet (800 mg total) by mouth every 8 (eight) hours as needed. 30 tablet 0   losartan (COZAAR) 50 MG tablet Take 50 mg by mouth every evening.      Melatonin 5 MG CAPS Take by mouth at bedtime as needed.     metFORMIN (GLUCOPHAGE-XR) 500 MG 24 hr tablet Take 1,000 mg by mouth 2 (two) times daily.     Multiple Vitamins-Minerals (MULTIVITAMIN ADULT) TABS Take 1  tablet by mouth daily.     rosuvastatin (CRESTOR) 10 MG tablet Take 10 mg by mouth every evening.      No current facility-administered medications for this visit.    Allergies:   Covid-19 mrna vacc (moderna), Rifampin, and Sulfonamide derivatives    Social History:  The patient  reports that she has never smoked. She has never used smokeless tobacco. She reports previous alcohol use. She reports that she does not use drugs.   Family History:  The patient's family history includes Cancer (age of onset: 24) in her mother; Hypertension in her mother.    ROS:  Please see the history of present illness.   Otherwise, review of systems are positive for occasional indifestion.   All other systems are reviewed and negative.    PHYSICAL EXAM: VS:  BP (!) 144/88    Pulse 86    Ht 5\' 1"  (1.549 m)    Wt 182 lb 12.8 oz (82.9 kg)    LMP 04/08/2014     SpO2 96%    BMI 34.54 kg/m  , BMI Body mass index is 34.54 kg/m. GEN: Well nourished, well developed, in no acute distress  HEENT: normal  Neck: no JVD, carotid bruits, or masses Cardiac: RRR; no murmurs, rubs, or gallops,no edema  Respiratory:  clear to auscultation bilaterally, normal work of breathing GI: soft, nontender, nondistended, + BS MS: no deformity or atrophy  Skin: warm and dry, no rash Neuro:  Strength and sensation are intact Psych: euthymic mood, full affect   EKG:   The ekg ordered today demonstrates NSR LBBB   Recent Labs: 08/12/2020: BUN 17; Creatinine, Ser 0.60; Hemoglobin 15.3; Potassium 4.4; Sodium 138   Lipid Panel    Component Value Date/Time   CHOL 202 (H) 03/04/2008 2036   TRIG 301 (H) 03/04/2008 2036   HDL 31 (L) 03/04/2008 2036   CHOLHDL 6.5 Ratio 03/04/2008 2036   VLDL 60 (H) 03/04/2008 2036   LDLCALC 111 (H) 03/04/2008 2036     Other studies Reviewed: Additional studies/ records that were reviewed today with results demonstrating: prior ECGs reviewed.   ASSESSMENT AND PLAN:  1. Abnormal ECG: ECG was done as a routine before surgery.  Check echocardiogram.  ECG unchanged from 2020 as well.  LBBB was present at that time.  2. HTN: Home readings are also in the 140/88 range. Increase losartan to 100 mg daily.  Avoid salt and red meat in the diet.  She will f/u with Dr. Shelia Media.  I will send him a note so that he can f/u onBP. 3. DM: A1C 8.3.  Whole food plant based diet.  Increase fiber intake.   Current medicines are reviewed at length with the patient today.  The patient concerns regarding her medicines were addressed.  The following changes have been made:  No change  Labs/ tests ordered today include:   Orders Placed This Encounter  Procedures   ECHOCARDIOGRAM COMPLETE    Recommend 150 minutes/week of aerobic exercise Low fat, low carb, high fiber diet recommended  Disposition:   FU in 1 year, or sooner if there are issues with  the echo.    Signed, Larae Grooms, MD  09/08/2020 3:28 PM    [facility] [place], [place], [place]  [address] Phone: [phone]; Fax: [phone]

## 2020-09-08 NOTE — Patient Instructions (Addendum)
Medication Instructions:  Your physician has recommended you make the following change in your medication:   INCREASE: losartan to 100 mg once a day  *If you need a refill on your cardiac medications before your next appointment, please call your pharmacy*   Lab Work: None  If you have labs (blood work) drawn today and your tests are completely normal, you will receive your results only by: Marland Kitchen MyChart Message (if you have MyChart) OR . A paper copy in the mail If you have any lab test that is abnormal or we need to change your treatment, we will call you to review the results.   Testing/Procedures: Your physician has requested that you have an echocardiogram. Echocardiography is a painless test that uses sound waves to create images of your heart. It provides your doctor with information about the size and shape of your heart and how well your heart's chambers and valves are working. This procedure takes approximately one hour. There are no restrictions for this procedure.  Follow-Up: At [facility], you and your health needs are our priority.  As part of our continuing mission to provide you with exceptional heart care, we have created designated Provider Care Teams.  These Care Teams include your primary Cardiologist (physician) and Advanced Practice Providers (APPs -  Physician Assistants and Nurse Practitioners) who all work together to provide you with the care you need, when you need it.  We recommend signing up for the patient portal called "MyChart".  Sign up information is provided on this After Visit Summary.  MyChart is used to connect with patients for Virtual Visits (Telemedicine).  Patients are able to view lab/test results, encounter notes, upcoming appointments, etc.  Non-urgent messages can be sent to your provider as well.   To learn more about what you can do with MyChart, go to [email or web address].    Your next appointment:   12 month(s)  The format for your  next appointment:   In Person  Provider:   You may see Casandra Doffing, MD or one of the following Advanced Practice Providers on your designated Care Team:    Melina Copa, PA-C  Ermalinda Barrios, PA-C    Other Instructions  High-Fiber Diet Fiber, also called dietary fiber, is a type of carbohydrate that is found in fruits, vegetables, whole grains, and beans. A high-fiber diet can have many health benefits. Your health care provider may recommend a high-fiber diet to help:  Prevent constipation. Fiber can make your bowel movements more regular.  Lower your cholesterol.  Relieve the following conditions: ? Swelling of veins in the anus (hemorrhoids). ? Swelling and irritation (inflammation) of specific areas of the digestive tract (uncomplicated diverticulosis). ? A problem of the large intestine (colon) that sometimes causes pain and diarrhea (irritable bowel syndrome, IBS).  Prevent overeating as part of a weight-loss plan.  Prevent heart disease, type 2 diabetes, and certain cancers. What is my plan? The recommended daily fiber intake in grams (g) includes:  38 g for men age 66 or younger.  30 g for men over age 74.  82 g for women age 27 or younger.  21 g for women over age 57. You can get the recommended daily intake of dietary fiber by:  Eating a variety of fruits, vegetables, grains, and beans.  Taking a fiber supplement, if it is not possible to get enough fiber through your diet. What do I need to know about a high-fiber diet?  It is better to get fiber  through food sources rather than from fiber supplements. There is not a lot of research about how effective supplements are.  Always check the fiber content on the nutrition facts label of any prepackaged food. Look for foods that contain 5 g of fiber or more per serving.  Talk with a diet and nutrition specialist (dietitian) if you have questions about specific foods that are recommended or not recommended for your  medical condition, especially if those foods are not listed below.  Gradually increase how much fiber you consume. If you increase your intake of dietary fiber too quickly, you may have bloating, cramping, or gas.  Drink plenty of water. Water helps you to digest fiber. What are tips for following this plan?  Eat a wide variety of high-fiber foods.  Make sure that half of the grains that you eat each day are whole grains.  Eat breads and cereals that are made with whole-grain flour instead of refined flour or white flour.  Eat brown rice, bulgur wheat, or millet instead of white rice.  Start the day with a breakfast that is high in fiber, such as a cereal that contains 5 g of fiber or more per serving.  Use beans in place of meat in soups, salads, and pasta dishes.  Eat high-fiber snacks, such as berries, raw vegetables, nuts, and popcorn.  Choose whole fruits and vegetables instead of processed forms like juice or sauce. What foods can I eat?  Fruits Berries. Pears. Apples. Oranges. Avocado. Prunes and raisins. Dried figs. Vegetables Sweet potatoes. Spinach. Kale. Artichokes. Cabbage. Broccoli. Cauliflower. Green peas. Carrots. Squash. Grains Whole-grain breads. Multigrain cereal. Oats and oatmeal. Brown rice. Barley. Bulgur wheat. [place]. Quinoa. Bran muffins. Popcorn. Rye wafer crackers. Meats and other proteins Navy, kidney, and pinto beans. Soybeans. Split peas. Lentils. Nuts and seeds. Dairy Fiber-fortified yogurt. Beverages Fiber-fortified soy milk. Fiber-fortified orange juice. Other foods Fiber bars. The items listed above may not be a complete list of recommended foods and beverages. Contact a dietitian for more options. What foods are not recommended? Fruits Fruit juice. Cooked, strained fruit. Vegetables Fried potatoes. Canned vegetables. Well-cooked vegetables. Grains White bread. Pasta made with refined flour. White rice. Meats and other proteins Fatty  cuts of meat. Fried chicken or fried fish. Dairy Milk. Yogurt. Cream cheese. Sour cream. Fats and oils Butters. Beverages Soft drinks. Other foods Cakes and pastries. The items listed above may not be a complete list of foods and beverages to avoid. Contact a dietitian for more information. Summary  Fiber is a type of carbohydrate. It is found in fruits, vegetables, whole grains, and beans.  There are many health benefits of eating a high-fiber diet, such as preventing constipation, lowering blood cholesterol, helping with weight loss, and reducing your risk of heart disease, diabetes, and certain cancers.  Gradually increase your intake of fiber. Increasing too fast can result in cramping, bloating, and gas. Drink plenty of water while you increase your fiber.  The best sources of fiber include whole fruits and vegetables, whole grains, nuts, seeds, and beans. This information is not intended to replace advice given to you by your health care provider. Make sure you discuss any questions you have with your health care provider. Document Revised: 09/05/2017 Document Reviewed: 09/05/2017 Elsevier Patient Education  2020 [employer].

## 2020-09-08 NOTE — Addendum Note (Signed)
Addended by: Drue Novel I on: 09/08/2020 04:17 PM   Modules accepted: Orders

## 2020-09-10 ENCOUNTER — Ambulatory Visit: Payer: No Typology Code available for payment source | Admitting: Dermatology

## 2020-09-10 ENCOUNTER — Other Ambulatory Visit: Payer: Self-pay

## 2020-09-10 ENCOUNTER — Ambulatory Visit (INDEPENDENT_AMBULATORY_CARE_PROVIDER_SITE_OTHER): Payer: 59 | Admitting: *Deleted

## 2020-09-10 VITALS — BP 140/82 | HR 88 | Resp 14 | Ht 60.75 in | Wt 182.4 lb

## 2020-09-10 DIAGNOSIS — Z87448 Personal history of other diseases of urinary system: Secondary | ICD-10-CM

## 2020-09-10 DIAGNOSIS — R319 Hematuria, unspecified: Secondary | ICD-10-CM

## 2020-09-10 LAB — POCT URINALYSIS DIPSTICK
Bilirubin, UA: NEGATIVE
Glucose, UA: POSITIVE — AB
Ketones, UA: NEGATIVE
Leukocytes, UA: NEGATIVE
Nitrite, UA: NEGATIVE
Protein, UA: NEGATIVE
Urobilinogen, UA: 0.2 E.U./dL
pH, UA: 5 (ref 5.0–8.0)

## 2020-09-10 NOTE — Progress Notes (Signed)
Patient here for urinalysis to check for blood in urine. Patient denies urinary symptoms today. Clean catch urine provided. UA positive for moderate RBC's. Patient updated and advised would send urine to lab for micro. Patient verbalized understanding. States she has been told since she was young that she had blood in her urine, and was seen by a Urologist in her 20's and told to do "urinary exercises" to help.  Patient advised once results received and reviewed by Dr. Talbert Nan, patient would be update. Patient agreeable.   Routing to provider and will close encounter.

## 2020-09-11 ENCOUNTER — Other Ambulatory Visit: Payer: Self-pay | Admitting: *Deleted

## 2020-09-11 DIAGNOSIS — R3129 Other microscopic hematuria: Secondary | ICD-10-CM

## 2020-09-11 DIAGNOSIS — Z87448 Personal history of other diseases of urinary system: Secondary | ICD-10-CM

## 2020-09-11 LAB — URINALYSIS, MICROSCOPIC ONLY
Bacteria, UA: NONE SEEN
Casts: NONE SEEN /lpf

## 2020-09-30 ENCOUNTER — Ambulatory Visit (HOSPITAL_COMMUNITY): Payer: 59 | Attending: Cardiovascular Disease

## 2020-09-30 ENCOUNTER — Other Ambulatory Visit: Payer: Self-pay

## 2020-09-30 DIAGNOSIS — R9431 Abnormal electrocardiogram [ECG] [EKG]: Secondary | ICD-10-CM | POA: Insufficient documentation

## 2020-09-30 LAB — ECHOCARDIOGRAM COMPLETE
Area-P 1/2: 5.66 cm2
S' Lateral: 3.1 cm

## 2020-10-20 ENCOUNTER — Telehealth: Payer: Self-pay | Admitting: Interventional Cardiology

## 2020-10-20 NOTE — Telephone Encounter (Signed)
Left detailed message of results on VM (DPR on file). Instructed for patient to call back with any questions.

## 2020-10-20 NOTE — Telephone Encounter (Signed)
Patient returning call for echo results. She states she cannot answer her phone at work and to leave a vm with the results on her home phone.

## 2020-10-20 NOTE — Telephone Encounter (Signed)
-----   Message from Jettie Booze, MD sent at 10/06/2020 10:04 AM EST ----- Normal LV/RV function.  Normal valvular function.

## 2020-12-02 ENCOUNTER — Ambulatory Visit: Payer: 59 | Admitting: Neurology

## 2021-05-12 ENCOUNTER — Ambulatory Visit (HOSPITAL_COMMUNITY)
Admission: EM | Admit: 2021-05-12 | Discharge: 2021-05-12 | Disposition: A | Discharge status: Home / Self Care | Payer: 59 | Attending: Family Medicine | Admitting: Family Medicine

## 2021-05-12 ENCOUNTER — Encounter (HOSPITAL_COMMUNITY): Payer: Self-pay

## 2021-05-12 ENCOUNTER — Other Ambulatory Visit: Payer: Self-pay

## 2021-05-12 DIAGNOSIS — S0990XA Unspecified injury of head, initial encounter: Secondary | ICD-10-CM | POA: Diagnosis not present

## 2021-05-12 DIAGNOSIS — R519 Headache, unspecified: Secondary | ICD-10-CM

## 2021-05-12 DIAGNOSIS — R112 Nausea with vomiting, unspecified: Secondary | ICD-10-CM

## 2021-05-12 DIAGNOSIS — R42 Dizziness and giddiness: Secondary | ICD-10-CM
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
## 2022-06-14 NOTE — Progress Notes (Unsigned)
56 y.o. G0P0000 Single White or Caucasian Not Hispanic or Latino female here for annual exam.  She had some blood on her toilet paper about 3 weeks ago. She isn't sure if the blood was from her bladder or her vagina. H/O hysteroscopy, polypectomy and D&C 2 years ago. Pathology was benign.  She was noted to have blood in her urine a few years ago, was supposed to f/u with Urology but never did.  No bladder c/o.  C/O dark stool.   She has recently been on 3 different antibiotics for an ear infection.     Patient's last menstrual period was 03/30/2014.          Sexually active: No.  The current method of family planning is none.    Exercising: No.  The patient does not participate in regular exercise at present. Smoker:  no  Health Maintenance: Pap:  06/30/20 WNL Hr HPV Neg, 01/28/15 WNL  History of abnormal Pap:  yes h/o leep at 21 MMG:  08/08/20 density B Bi-rads 1 neg BMD:   n/a Colonoscopy: 4 years ago polyp F/u 5 years due to family history  TDaP:  2004 Gardasil: n/a   reports that she has never smoked. She has never used smokeless tobacco. She reports that she does not currently use alcohol. She reports that she does not use drugs. No ETOH. 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   Past Medical History:  Diagnosis Date   Acne    Allergy to IVP dye    Axillary hidradenitis suppurativa    Endometriosis    Family history of colon cancer    Family history of heart disease    Fatigue    Fibroid    Hand pain    Hematuria    Hidradenitis    History of abnormal cervical Pap smear    History of uterine fibroid    Hypercholesterolemia    Hyperlipidemia    Hypertension    Hypertriglyceridemia    Iron deficiency anemia    Obese    PMB (postmenopausal bleeding)    Polyarthralgia    Prediabetes    Shift work sleep disorder    Swimmer's ear    Trochanteric bursitis     Past Surgical History:  Procedure Laterality Date   breast biopsies Left 6/15   benign   CARPAL TUNNEL RELEASE Bilateral yrs  ago   CERVICAL BIOPSY  W/ LOOP ELECTRODE EXCISION     she was in her mid 20's leep   [place] N/A 08/12/2020   Procedure: [place];  Surgeon: Salvadore Dom, MD;  Location: [facility];  Service: Gynecology;  Laterality: N/A;  Thickened endometrial lining, suspected endometrial polyps. Planning pre-treat with Cytotec.   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 30 tablet 0   losartan (COZAAR) 50 MG tablet Take 2 tablets (100 mg total) by mouth every evening. 180 tablet 3   metFORMIN (GLUCOPHAGE-XR) 500 MG 24 hr tablet Take 1,000 mg by mouth 2 (two) times daily.     Multiple Vitamins-Minerals (MULTIVITAMIN ADULT) TABS Take 1 tablet by mouth daily.     rosuvastatin (CRESTOR) 10 MG  tablet Take 10 mg by mouth every evening.      No current facility-administered medications for this visit.    Family History  Problem Relation Age of Onset   Hypertension Mother    Cancer Mother 11    Review of Systems  Genitourinary:  Positive for vaginal bleeding.    Exam:   BP 122/84   Pulse 88   Ht '5\' 1"'$  (1.549 m)   Wt 175 lb (79.4 kg)   LMP 03/30/2014   SpO2 100%   BMI 33.07 kg/m   Weight change: '@WEIGHTCHANGE'$ @ Height:   Height: '5\' 1"'$  (154.9 cm)  Ht Readings from Last 3 Encounters:  06/15/22 '5\' 1"'$  (1.549 m)  09/10/20 5' 0.75" (1.543 m)  09/08/20 '5\' 1"'$  (1.549 m)    General appearance: alert, cooperative and appears stated age Head: Normocephalic, without obvious abnormality, atraumatic Neck: no adenopathy, supple, symmetrical, trachea midline and thyroid normal to inspection and palpation Lungs: clear to  auscultation bilaterally Cardiovascular: regular rate and rhythm Breasts: normal appearance, no masses or tenderness Abdomen: soft, non-tender; non distended,  no masses,  no organomegaly Extremities: extremities normal, atraumatic, no cyanosis or edema Skin: Skin color, texture, turgor normal. No rashes or lesions Lymph nodes: Cervical, supraclavicular, and axillary nodes normal. No abnormal inguinal nodes palpated Neurologic: Grossly normal   Pelvic: External genitalia:  no lesions              Urethra:  normal appearing urethra with no masses, tenderness or lesions              Bartholins and Skenes: normal                 Vagina: normal appearing vagina with normal color and discharge, no lesions              Cervix:  unable to visualize, no lesions palpated. Unable to see with an adolescent speculum, unable to tolerate a long pederson.                Bimanual Exam:  Uterus:   no masses or tenderness              Adnexa: no mass, fullness, tenderness               Rectovaginal: Confirms               Anus:  normal sphincter tone, no lesions  Gae Dry chaperoned for the exam.  1. Well woman exam Discussed breast self exam Discussed calcium and vit D intake She will set up her mammogram Labs with primary  2. Postmenopausal bleeding Limited exam. Unable to see her cervix well.  - US PELVIS TRANSVAGINAL NON-OB (TV ONLY); Future  3. Vaginal atrophy Limiting exam  4. History of blood in urine Never f/u with Urology - Urinalysis, Complete  5. Dark stools Colonoscopy is UTD - Fecal occult blood, immunochemical  Addendum: urinalysis with 10-20 RBC/HPF Will refer to Urology

## 2022-06-15 ENCOUNTER — Ambulatory Visit (INDEPENDENT_AMBULATORY_CARE_PROVIDER_SITE_OTHER): Payer: 59 | Admitting: Obstetrics and Gynecology

## 2022-06-15 ENCOUNTER — Encounter: Payer: Self-pay | Admitting: Obstetrics and Gynecology

## 2022-06-15 ENCOUNTER — Telehealth: Payer: Self-pay | Admitting: Obstetrics and Gynecology

## 2022-06-15 ENCOUNTER — Other Ambulatory Visit: Payer: Self-pay

## 2022-06-15 VITALS — BP 122/84 | HR 88 | Ht 61.0 in | Wt 175.0 lb

## 2022-06-15 DIAGNOSIS — G4726 Circadian rhythm sleep disorder, shift work type: Secondary | ICD-10-CM | POA: Insufficient documentation

## 2022-06-15 DIAGNOSIS — R319 Hematuria, unspecified: Secondary | ICD-10-CM

## 2022-06-15 DIAGNOSIS — N952 Postmenopausal atrophic vaginitis: Secondary | ICD-10-CM

## 2022-06-15 DIAGNOSIS — E1165 Type 2 diabetes mellitus with hyperglycemia: Secondary | ICD-10-CM | POA: Insufficient documentation

## 2022-06-15 DIAGNOSIS — Z800 Family history of malignant neoplasm of digestive organs: Secondary | ICD-10-CM | POA: Insufficient documentation

## 2022-06-15 DIAGNOSIS — I10 Essential (primary) hypertension: Secondary | ICD-10-CM | POA: Insufficient documentation

## 2022-06-15 DIAGNOSIS — L732 Hidradenitis suppurativa: Secondary | ICD-10-CM | POA: Insufficient documentation

## 2022-06-15 DIAGNOSIS — J45909 Unspecified asthma, uncomplicated: Secondary | ICD-10-CM | POA: Insufficient documentation

## 2022-06-15 DIAGNOSIS — Z8742 Personal history of other diseases of the female genital tract: Secondary | ICD-10-CM | POA: Insufficient documentation

## 2022-06-15 DIAGNOSIS — D509 Iron deficiency anemia, unspecified: Secondary | ICD-10-CM | POA: Insufficient documentation

## 2022-06-15 DIAGNOSIS — N950 Postmenopausal bleeding: Secondary | ICD-10-CM

## 2022-06-15 DIAGNOSIS — E785 Hyperlipidemia, unspecified: Secondary | ICD-10-CM | POA: Insufficient documentation

## 2022-06-15 DIAGNOSIS — R195 Other fecal abnormalities: Secondary | ICD-10-CM

## 2022-06-15 DIAGNOSIS — Z87448 Personal history of other diseases of urinary system: Secondary | ICD-10-CM

## 2022-06-15 DIAGNOSIS — Z85828 Personal history of other malignant neoplasm of skin: Secondary | ICD-10-CM | POA: Insufficient documentation

## 2022-06-15 DIAGNOSIS — M7060 Trochanteric bursitis, unspecified hip: Secondary | ICD-10-CM | POA: Insufficient documentation

## 2022-06-15 DIAGNOSIS — J321 Chronic frontal sinusitis: Secondary | ICD-10-CM | POA: Insufficient documentation

## 2022-06-15 DIAGNOSIS — L709 Acne, unspecified: Secondary | ICD-10-CM | POA: Insufficient documentation

## 2022-06-15 DIAGNOSIS — E119 Type 2 diabetes mellitus without complications: Secondary | ICD-10-CM | POA: Insufficient documentation

## 2022-06-15 DIAGNOSIS — Z8249 Family history of ischemic heart disease and other diseases of the circulatory system: Secondary | ICD-10-CM | POA: Insufficient documentation

## 2022-06-15 DIAGNOSIS — Z01419 Encounter for gynecological examination (general) (routine) without abnormal findings: Secondary | ICD-10-CM | POA: Diagnosis not present

## 2022-06-15 DIAGNOSIS — R5383 Other fatigue: Secondary | ICD-10-CM | POA: Insufficient documentation

## 2022-06-15 DIAGNOSIS — M2550 Pain in unspecified joint: Secondary | ICD-10-CM | POA: Insufficient documentation

## 2022-06-15 DIAGNOSIS — M79643 Pain in unspecified hand: Secondary | ICD-10-CM | POA: Insufficient documentation

## 2022-06-15 DIAGNOSIS — Z6833 Body mass index (BMI) 33.0-33.9, adult: Secondary | ICD-10-CM | POA: Insufficient documentation

## 2022-06-15 LAB — URINALYSIS, COMPLETE
Bilirubin Urine: NEGATIVE
Glucose, UA: NEGATIVE
Hyaline Cast: NONE SEEN /LPF
Leukocytes,Ua: NEGATIVE
Nitrite: NEGATIVE
Protein, ur: NEGATIVE
Specific Gravity, Urine: 1.025 (ref 1.001–1.035)
pH: 5 (ref 5.0–8.0)

## 2022-06-15 NOTE — Telephone Encounter (Signed)
Please let the patient know that her urinalysis from this am had a significant amount of blood in it. She must be seen by Urology. Please place the referral.

## 2022-06-15 NOTE — Telephone Encounter (Signed)
Left message home ans machine to call.

## 2022-06-15 NOTE — Patient Instructions (Signed)

## 2022-06-15 NOTE — Telephone Encounter (Signed)
Patient called. Informed. She is agreeable to referral.  Referral faxed to [facility].

## 2022-06-17 ENCOUNTER — Telehealth: Payer: Self-pay | Admitting: *Deleted

## 2022-06-17 ENCOUNTER — Other Ambulatory Visit: Payer: 59

## 2022-06-17 DIAGNOSIS — Z0289 Encounter for other administrative examinations: Secondary | ICD-10-CM

## 2022-06-17 NOTE — Telephone Encounter (Signed)
Dr. Talbert Nan request to do US abdominally secondary to vaginal atrophy. Patient will need to arrive with full bladder for PUS.  Call placed to patient to schedule PUS, left detailed message, ok per dpr.  Scheduled for PUS on 07/08/22 at 1:30 pm with consult to follow with Dr. Talbert Nan. Please return call to [place], [place] at [phone], OPT 5 to confirm appt or make changes if needed.

## 2022-06-18 NOTE — Telephone Encounter (Signed)
Patient returned call.  Confirmed PUS for 07/08/22 at 1:30pm, arrive 1:15pm with full bladder.   Order previously placed.   Routing to provider for final review. Patient is agreeable to disposition. Will close encounter.  Cc: Kimalexis

## 2022-07-08 ENCOUNTER — Ambulatory Visit: Payer: 59

## 2022-07-08 ENCOUNTER — Encounter: Payer: Self-pay | Admitting: Obstetrics and Gynecology

## 2022-07-08 ENCOUNTER — Ambulatory Visit (INDEPENDENT_AMBULATORY_CARE_PROVIDER_SITE_OTHER): Payer: 59 | Admitting: Obstetrics and Gynecology

## 2022-07-08 DIAGNOSIS — N950 Postmenopausal bleeding: Secondary | ICD-10-CM

## 2022-07-08 NOTE — Progress Notes (Signed)
GYNECOLOGY  VISIT   HPI: 56 y.o.   Single White or Caucasian Not Hispanic or Latino  female   G0P0000 with Patient's last menstrual period was 03/30/2014.   here for  further evaluation of possible PMP bleeding. When she was seen for her annual exam earlier this month she reported seeing blood on the toilet paper, wasn't sure if it was coming from her vaginal or her bladder. Unable to examen her well secondary to discomfort with speculum exam. I couldn't see her cervix with an adolescent speculum and she was unable to tolerate a pederson speculum.  She has a h/o hematuria (didn't go for the recommended w/u) and had blood in her urine at the time of her annual exam. She was referred to Urology, but hasn't heard from them.  She hasn't had any further bleeding.     GYNECOLOGIC HISTORY: Patient's last menstrual period was 03/30/2014. Contraception:PMP Menopausal hormone therapy: No        OB History     Gravida  0   Para  0   Term  0   Preterm  0   AB  0   Living  0      SAB  0   IAB  0   Ectopic  0   Multiple  0   Live Births  0              Patient Active Problem List   Diagnosis Date Noted   Family history of cardiac disorder 06/15/2022   Hidradenitis 06/15/2022   Trochanteric bursitis 06/15/2022   Acne 06/15/2022   Asthma without status asthmaticus 06/15/2022   Chronic frontal sinusitis 06/15/2022   Essential hypertension 06/15/2022   Family history of malignant neoplasm of digestive organs 06/15/2022   Fatigue 06/15/2022   Hand pain 06/15/2022   History of abnormal cervical Papanicolaou smear 06/15/2022   History of malignant neoplasm of skin 06/15/2022   Hyperglycemia due to type 2 diabetes mellitus ([place]) 06/15/2022   Hyperlipidemia 06/15/2022   Iron deficiency anemia 06/15/2022   Body mass index (BMI) 33.0-33.9, adult 06/15/2022   Polyarthralgia 06/15/2022   Shift work sleep disorder 06/15/2022   Type 2 diabetes mellitus without complications ([place])  64/33/2951   SYNDROME, CARPAL TUNNEL 06/28/2007   GERD 06/28/2007    Past Medical History:  Diagnosis Date   Acne    Allergy to IVP dye    Axillary hidradenitis suppurativa    Endometriosis    Family history of colon cancer    Family history of heart disease    Fatigue    Fibroid    Hand pain    Hematuria    Hidradenitis    History of abnormal cervical Pap smear    History of uterine fibroid    Hypercholesterolemia    Hyperlipidemia    Hypertension    Hypertriglyceridemia    Iron deficiency anemia    Obese    PMB (postmenopausal bleeding)    Polyarthralgia    Prediabetes    Shift work sleep disorder    Swimmer's ear    Trochanteric bursitis     Past Surgical History:  Procedure Laterality Date   breast biopsies Left 04/2014   benign   CARPAL TUNNEL RELEASE Bilateral yrs ago   CERVICAL BIOPSY  W/ LOOP ELECTRODE EXCISION     she was in her mid 20's leep   DILATATION & CURETTAGE/HYSTEROSCOPY WITH MYOSURE N/A 08/12/2020   Procedure: DILATATION & CURETTAGE/HYSTEROSCOPY WITH MYOSURE;  Surgeon: Salvadore Dom,  MD;  Location: [facility];  Service: Gynecology;  Laterality: N/A;  Thickened endometrial lining, suspected endometrial polyps. Planning pre-treat with Cytotec.   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 30 tablet 0   losartan (COZAAR) 50 MG tablet Take 2 tablets (100 mg total) by mouth every evening. 180 tablet 3   metFORMIN (GLUCOPHAGE-XR) 500 MG 24 hr tablet Take 1,000 mg by mouth 2 (two) times daily.     Multiple Vitamins-Minerals (MULTIVITAMIN ADULT) TABS Take 1 tablet by mouth daily.     rosuvastatin (CRESTOR) 10 MG tablet Take 10 mg  by mouth every evening.      No current facility-administered medications for this visit.     ALLERGIES: Covid-19 mrna vacc (moderna), Rifampin, and Sulfonamide derivatives  Family History  Problem Relation Age of Onset   Hypertension Mother    Cancer Mother 33    Social History   Socioeconomic History   Marital status: Single    Spouse name: Not on file   Number of children: Not on file   Years of education: Not on file   Highest education level: Not on file  Occupational History   Not on file  Tobacco Use   Smoking status: Never   Smokeless tobacco: Never  Substance and Sexual Activity   Alcohol use: Not Currently   Drug use: No   Sexual activity: Not Currently    Birth control/protection: None  Other Topics Concern   Not on file  Social History Narrative   Not on file   Social Determinants of Health   Financial Resource Strain: Not on file  Food Insecurity: Not on file  Transportation Needs: Not on file  Physical Activity: Not on file  Stress: Not on file  Social Connections: Not on file  Intimate Partner Violence: Not on file    ROS  PHYSICAL EXAMINATION:    LMP 03/30/2014     General appearance: alert, cooperative and appears stated age  Pelvic ultrasound, transabdominal imaging only  Indications: possible PMP bleeding  Findings:  Uterus 11.0 x 4.64 x 5.83 cm Fibroids: 1) 3.18 x 2.43 cm, fundal/anterior 2) 3.67 x 3.50 cm, anterior/intramural 3) 3.04 x 1.95 cm, anterior/LUS 4) 2.98 x 2.25 cm, subserosal  Endometrium 1.9 mm, no obvious masses, somewhat limited exam secondary to anterior fibroids and shadowing  Left ovary not seen    Right ovary not seen    No free fluid  Impression:  Slightly enlarged, fibroid uterus Endometrium 1.9 mm, no masses seen, somewhat limited exam Ovaries not seen, no adnexal masses  1. Postmenopausal bleeding Unclear if the blood was from her vagina or bladder. She was noted to have hematuria on ua.  - US  Pelvis Complete without concerning findings, but somewhat limited exam -She will f/u with Urology -If she has further bleeding, coming from her vagina, she will need a hysteroscopy, D&C

## 2022-07-08 NOTE — Progress Notes (Unsigned)
GYNECOLOGY  VISIT   HPI: 56 y.o.   Single White or Caucasian Not Hispanic or Latino  female   G0P0000 with Patient's last menstrual period was 03/30/2014.   here for  further evaluation of possible PMP bleeding. When she was seen for her annual exam earlier this month she reported seeing blood on the toilet paper, wasn't sure if it was coming from her vaginal or her bladder. Unable to examen her well secondary to discomfort with speculum exam. I couldn't see her cervix with an adolescent speculum and she was unable to tolerate a pederson speculum.  She has a h/o hematuria (didn't go for the recommended w/u) and had blood in her urine at the time of her annual exam. She was referred to Urology, but hasn't heard from them.  She hasn't had any further bleeding.     GYNECOLOGIC HISTORY: Patient's last menstrual period was 03/30/2014. Contraception:PMP Menopausal hormone therapy: No        OB History     Gravida  0   Para  0   Term  0   Preterm  0   AB  0   Living  0      SAB  0   IAB  0   Ectopic  0   Multiple  0   Live Births  0              Patient Active Problem List   Diagnosis Date Noted   Family history of cardiac disorder 06/15/2022   Hidradenitis 06/15/2022   Trochanteric bursitis 06/15/2022   Acne 06/15/2022   Asthma without status asthmaticus 06/15/2022   Chronic frontal sinusitis 06/15/2022   Essential hypertension 06/15/2022   Family history of malignant neoplasm of digestive organs 06/15/2022   Fatigue 06/15/2022   Hand pain 06/15/2022   History of abnormal cervical Papanicolaou smear 06/15/2022   History of malignant neoplasm of skin 06/15/2022   Hyperglycemia due to type 2 diabetes mellitus ([place]) 06/15/2022   Hyperlipidemia 06/15/2022   Iron deficiency anemia 06/15/2022   Body mass index (BMI) 33.0-33.9, adult 06/15/2022   Polyarthralgia 06/15/2022   Shift work sleep disorder 06/15/2022   Type 2 diabetes mellitus without complications ([place])  86/57/8469   SYNDROME, CARPAL TUNNEL 06/28/2007   GERD 06/28/2007    Past Medical History:  Diagnosis Date   Acne    Allergy to IVP dye    Axillary hidradenitis suppurativa    Endometriosis    Family history of colon cancer    Family history of heart disease    Fatigue    Fibroid    Hand pain    Hematuria    Hidradenitis    History of abnormal cervical Pap smear    History of uterine fibroid    Hypercholesterolemia    Hyperlipidemia    Hypertension    Hypertriglyceridemia    Iron deficiency anemia    Obese    PMB (postmenopausal bleeding)    Polyarthralgia    Prediabetes    Shift work sleep disorder    Swimmer's ear    Trochanteric bursitis     Past Surgical History:  Procedure Laterality Date   breast biopsies Left 04/2014   benign   CARPAL TUNNEL RELEASE Bilateral yrs ago   CERVICAL BIOPSY  W/ LOOP ELECTRODE EXCISION     she was in her mid 20's leep   DILATATION & CURETTAGE/HYSTEROSCOPY WITH MYOSURE N/A 08/12/2020   Procedure: DILATATION & CURETTAGE/HYSTEROSCOPY WITH MYOSURE;  Surgeon: Salvadore Dom,  MD;  Location: [facility];  Service: Gynecology;  Laterality: N/A;  Thickened endometrial lining, suspected endometrial polyps. Planning pre-treat with Cytotec.   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 30 tablet 0   losartan (COZAAR) 50 MG tablet Take 2 tablets (100 mg total) by mouth every evening. 180 tablet 3   metFORMIN (GLUCOPHAGE-XR) 500 MG 24 hr tablet Take 1,000 mg by mouth 2 (two) times daily.     Multiple Vitamins-Minerals (MULTIVITAMIN ADULT) TABS Take 1 tablet by mouth daily.     rosuvastatin (CRESTOR) 10 MG tablet Take 10 mg  by mouth every evening.      No current facility-administered medications for this visit.     ALLERGIES: Covid-19 mrna vacc (moderna), Rifampin, and Sulfonamide derivatives  Family History  Problem Relation Age of Onset   Hypertension Mother    Cancer Mother 36    Social History   Socioeconomic History   Marital status: Single    Spouse name: Not on file   Number of children: Not on file   Years of education: Not on file   Highest education level: Not on file  Occupational History   Not on file  Tobacco Use   Smoking status: Never   Smokeless tobacco: Never  Substance and Sexual Activity   Alcohol use: Not Currently   Drug use: No   Sexual activity: Not Currently    Birth control/protection: None  Other Topics Concern   Not on file  Social History Narrative   Not on file   Social Determinants of Health   Financial Resource Strain: Not on file  Food Insecurity: Not on file  Transportation Needs: Not on file  Physical Activity: Not on file  Stress: Not on file  Social Connections: Not on file  Intimate Partner Violence: Not on file    ROS  PHYSICAL EXAMINATION:    LMP 03/30/2014     General appearance: alert, cooperative and appears stated age  Pelvic ultrasound, transabdominal imaging only  Indications: possible PMP bleeding  Findings:  Uterus 11.0 x 4.64 x 5.83 cm Fibroids: 1) 3.18 x 2.43 cm, fundal/anterior 2) 3.67 x 3.50 cm, anterior/intramural 3) 3.04 x 1.95 cm, anterior/LUS 4) 2.98 x 2.25 cm, subserosal  Endometrium 1.9 mm, no obvious masses, somewhat limited exam secondary to anterior fibroids and shadowing  Left ovary not seen    Right ovary not seen    No free fluid  Impression:  Slightly enlarged, fibroid uterus Endometrium 1.9 mm, no masses seen, somewhat limited exam Ovaries not seen, no adnexal masses  1. Postmenopausal bleeding Unclear if the blood was from her vagina or bladder. She was noted to have hematuria on ua.  - US  Pelvis Complete without concerning findings, but somewhat limited exam -She will f/u with Urology -If she has further bleeding, coming from her vagina, she will need a hysteroscopy, D&C

## 2022-07-08 NOTE — Telephone Encounter (Signed)
I called [facility] and held 18 minutes. They had referral just had not called patient due to short staff.    Appt scheduled for Thursday, Sept 7, 2pm with Dr. Gloriann Loan.  I called patient and per DPR access note on file left message with appt details and provided phone # for [facility] in the event she needed to reschedule.

## 2022-07-12 NOTE — Progress Notes (Unsigned)
Cardiology Office Note   Date:  07/13/2022   ID:  Colleen Cantrell, DOB 04-15-1966, MRN [id number]  PCP:  Deland Pretty, MD    No chief complaint on file.  LBBB  Wt Readings from Last 3 Encounters:  07/13/22 179 lb (81.2 kg)  07/08/22 179 lb (81.2 kg)  06/15/22 175 lb (79.4 kg)       History of Present Illness: Colleen Cantrell is a 56 y.o. female  who I saw for abnormal ECG in 2021.  Prior records show: "ECG on 08/12/20 showed NSR with LBBB.  No change from 4/20 ECG. ECG was done for preop before uterine polyp surgery.   Also DM diagnosed with DM.  Had been borderline for many years.  A1C 8.3 at last check.    Both parents had heart problems in their 28s.    She never smoked.... Increase losartan to 100 mg daily.  Avoid salt and red meat in the diet."  2021 echo showed: "Left ventricular ejection fraction, by estimation, is 60 to 65%. The  left ventricle has normal function. The left ventricle has no regional  wall motion abnormalities. Left ventricular diastolic parameters are  consistent with Grade I diastolic  dysfunction (impaired relaxation).   2. Right ventricular systolic function is normal. The right ventricular  size is normal. There is normal pulmonary artery systolic pressure.   3. The mitral valve is normal in structure. Mild mitral valve  regurgitation. No evidence of mitral stenosis.   4. The aortic valve is normal in structure. Aortic valve regurgitation is  not visualized. No aortic stenosis is present.   5. The inferior vena cava is normal in size with greater than 50%  respiratory variability, suggesting right atrial pressure of 3 mmHg. "  Started new job in 2021 in an [employer] job. Not much walking or physical activity at the job.  Denies : Chest pain. Dizziness. Leg edema. Nitroglycerin use. Orthopnea. Palpitations. Paroxysmal nocturnal dyspnea. Shortness of breath. Syncope.    Eats pretty healthy per her report.   Past Medical History:   Diagnosis Date   Acne    Allergy to IVP dye    Axillary hidradenitis suppurativa    Endometriosis    Family history of colon cancer    Family history of heart disease    Fatigue    Fibroid    Hand pain    Hematuria    Hidradenitis    History of abnormal cervical Pap smear    History of uterine fibroid    Hypercholesterolemia    Hyperlipidemia    Hypertension    Hypertriglyceridemia    Iron deficiency anemia    Obese    PMB (postmenopausal bleeding)    Polyarthralgia    Prediabetes    Shift work sleep disorder    Swimmer's ear    Trochanteric bursitis     Past Surgical History:  Procedure Laterality Date   breast biopsies Left 04/2014   benign   CARPAL TUNNEL RELEASE Bilateral yrs ago   CERVICAL BIOPSY  W/ LOOP ELECTRODE EXCISION     she was in her mid 20's leep   DILATATION & CURETTAGE/HYSTEROSCOPY WITH MYOSURE N/A 08/12/2020   Procedure: [place];  Surgeon: Salvadore Dom, MD;  Location: [facility];  Service: Gynecology;  Laterality: N/A;  Thickened endometrial lining, suspected endometrial polyps. Planning pre-treat with Cytotec.   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 30 tablet 0   losartan (COZAAR) 100 MG tablet Take 100 mg by mouth daily.     metFORMIN (GLUCOPHAGE-XR) 500 MG 24 hr tablet Take 1,000 mg by mouth 2 (two) times daily.     Multiple Vitamins-Minerals (MULTIVITAMIN ADULT) TABS Take 1 tablet by mouth daily.     rosuvastatin (CRESTOR) 10 MG tablet Take 10 mg by mouth every evening.      No current facility-administered medications for this visit.    Allergies:   Covid-19 mrna vacc  (moderna), Rifampin, and Sulfonamide derivatives    Social History:  The patient  reports that she has never smoked. She has never used smokeless tobacco. She reports that she does not currently use alcohol. She reports that she does not use drugs.   Family History:  The patient's family history includes Cancer (age of onset: 54) in her mother; Hypertension in her mother.    ROS:  Please see the history of present illness.   Otherwise, review of systems are positive for lack of exercise.   All other systems are reviewed and negative.    PHYSICAL EXAM: VS:  BP 118/78   Pulse 89   Ht '5\' 1"'$  (1.549 m)   Wt 179 lb (81.2 kg)   LMP 03/30/2014   SpO2 97%   BMI 33.82 kg/m  , BMI Body mass index is 33.82 kg/m. GEN: Well nourished, well developed, in no acute distress HEENT: normal Neck: no JVD, carotid bruits, or masses Cardiac: RRR; no murmurs, rubs, or gallops,no edema  Respiratory:  clear to auscultation bilaterally, normal work of breathing GI: soft, nontender, nondistended, + BS, obese MS: no deformity or atrophy Skin: warm and dry, no rash Neuro:  Strength and sensation are intact Psych: euthymic mood, full affect   EKG:   The ekg ordered today demonstrates NSR, LBBB pattern   Recent Labs: No results found for requested labs within last 365 days.   Lipid Panel    Component Value Date/Time   CHOL 202 (H) 03/04/2008 2036   TRIG 301 (H) 03/04/2008 2036   HDL 31 (L) 03/04/2008 2036   CHOLHDL 6.5 Ratio 03/04/2008 2036   VLDL 60 (H) 03/04/2008 2036   LDLCALC 111 (H) 03/04/2008 2036     Other studies Reviewed: Additional studies/ records that were reviewed today with results demonstrating: labs reviewed- 2023 labs not available.   ASSESSMENT AND PLAN:  Abnormal ECG/LBBB: Essentially unchanged.  No CHF sx. No angina.  HTN: Low-salt diet.  Avoid processed foods.  The current medical regimen is effective;  continue present plan and medications. DM: Whole food, plant-based  diet.  Exercise target as noted below.   On rosuvastatin 10 mg daily.  A1C was under 7 at last check per her report.   Family h/o early CAD: We discussed calcium scoring CT.  She will let us know about her decision regarding pursuing a calcium scoring CT.     Current medicines are reviewed at length with the patient today.  The patient concerns regarding her medicines were addressed.  The following changes have been made:  No change  Labs/ tests ordered today include:  No orders  of the defined types were placed in this encounter.   Recommend 150 minutes/week of aerobic exercise Low fat, low carb, high fiber diet recommended  Disposition:   FU in 2 years   Signed, Larae Grooms, MD  07/13/2022 8:25 AM    [facility] [address], [place], [place]  [address] Phone: [phone]; Fax: [phone]

## 2022-07-13 ENCOUNTER — Encounter: Payer: Self-pay | Admitting: Interventional Cardiology

## 2022-07-13 ENCOUNTER — Ambulatory Visit: Payer: 59 | Attending: Interventional Cardiology | Admitting: Interventional Cardiology

## 2022-07-13 VITALS — BP 118/78 | HR 89 | Ht 61.0 in | Wt 179.0 lb

## 2022-07-13 DIAGNOSIS — I10 Essential (primary) hypertension: Secondary | ICD-10-CM

## 2022-07-13 DIAGNOSIS — E119 Type 2 diabetes mellitus without complications: Secondary | ICD-10-CM

## 2022-07-13 DIAGNOSIS — I447 Left bundle-branch block, unspecified: Secondary | ICD-10-CM | POA: Diagnosis not present

## 2022-07-13 DIAGNOSIS — Z8249 Family history of ischemic heart disease and other diseases of the circulatory system: Secondary | ICD-10-CM | POA: Diagnosis not present

## 2022-07-13 DIAGNOSIS — R9431 Abnormal electrocardiogram [ECG] [EKG]: Secondary | ICD-10-CM | POA: Diagnosis not present

## 2022-07-13 NOTE — Patient Instructions (Signed)
Medication Instructions:  Your physician recommends that you continue on your current medications as directed. Please refer to the Current Medication list given to you today.  *If you need a refill on your cardiac medications before your next appointment, please call your pharmacy*   Lab Work: none If you have labs (blood work) drawn today and your tests are completely normal, you will receive your results only by: [facility] (if you have MyChart) OR A paper copy in the mail If you have any lab test that is abnormal or we need to change your treatment, we will call you to review the results.   Testing/Procedures: The test Dr Irish Lack talked with you about is a Calcium Score CT Scan.  Let our office know if you would like to have this done   Follow-Up: At [facility], you and your health needs are our priority.  As part of our continuing mission to provide you with exceptional heart care, we have created designated Provider Care Teams.  These Care Teams include your primary Cardiologist (physician) and Advanced Practice Providers (APPs -  Physician Assistants and Nurse Practitioners) who all work together to provide you with the care you need, when you need it.  We recommend signing up for the patient portal called "MyChart".  Sign up information is provided on this After Visit Summary.  MyChart is used to connect with patients for Virtual Visits (Telemedicine).  Patients are able to view lab/test results, encounter notes, upcoming appointments, etc.  Non-urgent messages can be sent to your provider as well.   To learn more about what you can do with MyChart, go to [email or web address].    Your next appointment:   2 year(s)  The format for your next appointment:   In Person  Provider:   Dr Irish Lack  Other Instructions High-Fiber Eating Plan Fiber, also called dietary fiber, is a type of carbohydrate. It is found foods such as fruits, vegetables, whole grains, and  beans. A high-fiber diet can have many health benefits. Your health care provider may recommend a high-fiber diet to help: Prevent constipation. Fiber can make your bowel movements more regular. Lower your cholesterol. Relieve the following conditions: Inflammation of veins in the anus (hemorrhoids). Inflammation of specific areas of the digestive tract (uncomplicated diverticulosis). A problem of the large intestine, also called the colon, that sometimes causes pain and diarrhea (irritable bowel syndrome, or IBS). Prevent overeating as part of a weight-loss plan. Prevent heart disease, type 2 diabetes, and certain cancers. What are tips for following this plan? Reading food labels  Check the nutrition facts label on food products for the amount of dietary fiber. Choose foods that have 5 grams of fiber or more per serving. The goals for recommended daily fiber intake include: Men (age 39 or younger): 34-38 g. Men (over age 17): 28-34 g. Women (age 64 or younger): 25-28 g. Women (over age 3): 22-25 g. Your daily fiber goal is _____________ g. Shopping Choose whole fruits and vegetables instead of processed forms, such as apple juice or applesauce. Choose a wide variety of high-fiber foods such as avocados, lentils, oats, and kidney beans. Read the nutrition facts label of the foods you choose. Be aware of foods with added fiber. These foods often have high sugar and sodium amounts per serving. Cooking Use whole-grain flour for baking and cooking. Cook with brown rice instead of white rice. Meal planning Start the day with a breakfast that is high in fiber, such as a cereal  that contains 5 g of fiber or more per serving. Eat breads and cereals that are made with whole-grain flour instead of refined flour or white flour. Eat brown rice, bulgur wheat, or millet instead of white rice. Use beans in place of meat in soups, salads, and pasta dishes. Be sure that half of the grains you eat  each day are whole grains. General information You can get the recommended daily intake of dietary fiber by: Eating a variety of fruits, vegetables, grains, nuts, and beans. Taking a fiber supplement if you are not able to take in enough fiber in your diet. It is better to get fiber through food than from a supplement. Gradually increase how much fiber you consume. If you increase your intake of dietary fiber too quickly, you may have bloating, cramping, or gas. Drink plenty of water to help you digest fiber. Choose high-fiber snacks, such as berries, raw vegetables, nuts, and popcorn. What foods should I eat? Fruits Berries. Pears. Apples. Oranges. Avocado. Prunes and raisins. Dried figs. Vegetables Sweet potatoes. Spinach. Kale. Artichokes. Cabbage. Broccoli. Cauliflower. Green peas. Carrots. Squash. Grains Whole-grain breads. Multigrain cereal. Oats and oatmeal. Brown rice. Barley. Bulgur wheat. [place]. Quinoa. Bran muffins. Popcorn. Rye wafer crackers. Meats and other proteins Navy beans, kidney beans, and pinto beans. Soybeans. Split peas. Lentils. Nuts and seeds. Dairy Fiber-fortified yogurt. Beverages Fiber-fortified soy milk. Fiber-fortified orange juice. Other foods Fiber bars. The items listed above may not be a complete list of recommended foods and beverages. Contact a dietitian for more information. What foods should I avoid? Fruits Fruit juice. Cooked, strained fruit. Vegetables Fried potatoes. Canned vegetables. Well-cooked vegetables. Grains White bread. Pasta made with refined flour. White rice. Meats and other proteins Fatty cuts of meat. Fried chicken or fried fish. Dairy Milk. Yogurt. Cream cheese. Sour cream. Fats and oils Butters. Beverages Soft drinks. Other foods Cakes and pastries. The items listed above may not be a complete list of foods and beverages to avoid. Talk with your dietitian about what choices are best for you. Summary Fiber is a type  of carbohydrate. It is found in foods such as fruits, vegetables, whole grains, and beans. A high-fiber diet has many benefits. It can help to prevent constipation, lower blood cholesterol, aid weight loss, and reduce your risk of heart disease, diabetes, and certain cancers. Increase your intake of fiber gradually. Increasing fiber too quickly may cause cramping, bloating, and gas. Drink plenty of water while you increase the amount of fiber you consume. The best sources of fiber include whole fruits and vegetables, whole grains, nuts, seeds, and beans. This information is not intended to replace advice given to you by your health care provider. Make sure you discuss any questions you have with your health care provider. Document Revised: 03/06/2020 Document Reviewed: 03/06/2020 Elsevier Patient Education  [place]

## 2022-08-31 ENCOUNTER — Telehealth: Payer: Self-pay | Admitting: *Deleted

## 2022-08-31 NOTE — Telephone Encounter (Signed)
Patient called and left message in triage voicemail stating she was seen by urology and a CT scan was ordered. It mentioned an ultrasound was needed, patient had pelvic ultrasound in 06/2022 here. She asked if another ultrasound was needed? I checked with medical records and the CT scan is here. I called patient back and left detailed message on her home voicemail per her request that you are out of the office this week and you will review report  and get back with her once you return. Please advise

## 2022-09-06 NOTE — Telephone Encounter (Signed)
Please let the patient know that I have reviewed her ultrasound images and her recent CT results. The finding on CT is c/w a fibroid. No further evaluation is needed at this time.

## 2022-09-06 NOTE — Telephone Encounter (Signed)
Left message patient to call.

## 2022-09-15 NOTE — Telephone Encounter (Signed)
Left message for patient to call.

## 2022-09-23 NOTE — Telephone Encounter (Signed)
Encounter closed patient never called back.

## 2022-09-27 NOTE — Telephone Encounter (Signed)
Patient called back asking I leave a detailed message on voicemail. Detailed message left on voicemail.

## 2024-07-10 ENCOUNTER — Encounter (HOSPITAL_COMMUNITY): Payer: Self-pay

## 2024-07-10 ENCOUNTER — Emergency Department (HOSPITAL_COMMUNITY)

## 2024-07-10 ENCOUNTER — Emergency Department (HOSPITAL_COMMUNITY)
Admission: EM | Admit: 2024-07-10 | Discharge: 2024-07-10 | Disposition: A | Discharge status: Home / Self Care | Attending: Emergency Medicine | Admitting: Emergency Medicine

## 2024-07-10 ENCOUNTER — Other Ambulatory Visit: Payer: Self-pay

## 2024-07-10 DIAGNOSIS — I10 Essential (primary) hypertension: Secondary | ICD-10-CM | POA: Insufficient documentation

## 2024-07-10 DIAGNOSIS — T7840XA Allergy, unspecified, initial encounter: Secondary | ICD-10-CM | POA: Diagnosis present

## 2024-07-10 DIAGNOSIS — Z7984 Long term (current) use of oral hypoglycemic drugs: Secondary | ICD-10-CM | POA: Insufficient documentation

## 2024-07-10 DIAGNOSIS — Z79899 Other long term (current) drug therapy: Secondary | ICD-10-CM | POA: Insufficient documentation

## 2024-07-10 LAB — BASIC METABOLIC PANEL WITH GFR
Anion gap: 17 — ABNORMAL HIGH (ref 5–15)
BUN: 15 mg/dL (ref 6–20)
CO2: 21 mmol/L — ABNORMAL LOW (ref 22–32)
Calcium: 9.4 mg/dL (ref 8.9–10.3)
Chloride: 100 mmol/L (ref 98–111)
Creatinine, Ser: 0.82 mg/dL (ref 0.44–1.00)
GFR, Estimated: >60 mL/min (ref >60)
Glucose, Bld: 192 mg/dL — ABNORMAL HIGH (ref 70–99)
Potassium: 3.9 mmol/L (ref 3.5–5.1)
Sodium: 138 mmol/L (ref 135–145)

## 2024-07-10 LAB — CBC
HCT: 43.5 % (ref 36.0–46.0)
Hemoglobin: 14.5 g/dL (ref 12.0–15.0)
MCH: 28.4 pg (ref 26.0–34.0)
MCHC: 33.3 g/dL (ref 30.0–36.0)
MCV: 85.3 fL (ref 80.0–100.0)
Platelets: 288 K/uL (ref 150–400)
RBC: 5.1 MIL/uL (ref 3.87–5.11)
RDW: 12.6 % (ref 11.5–15.5)
WBC: 14.6 K/uL — ABNORMAL HIGH (ref 4.0–10.5)
nRBC: 0 % (ref 0.0–0.2)

## 2024-07-10 LAB — TROPONIN T, HIGH SENSITIVITY: Troponin T High Sensitivity: <15 ng/L (ref 0–19)

## 2024-07-10 MED ORDER — EPINEPHRINE 0.3 MG/0.3ML IJ SOAJ: 0.3000 mg | INTRAMUSCULAR | 0 refills | Status: AC | PRN

## 2024-07-10 MED ORDER — PREDNISONE 20 MG PO TABS
40.0000 mg | ORAL_TABLET | Freq: Once | ORAL | Status: AC
  Administered 2024-07-10: 40 mg via ORAL
  Filled 2024-07-10: qty 2

## 2024-07-10 NOTE — ED Triage Notes (Signed)
 Pt was eating at a restaurant and began to turn red, have trouble breathing, chest pain. Pt was advised by FD to take 50 mg Benadryl , which she did- then she began to have the chest pain.

## 2024-07-10 NOTE — ED Provider Notes (Signed)
 [place] EMERGENCY DEPARTMENT AT [facility] Provider Note   CSN: [id number] Arrival date & time: 07/10/24  1954     Patient presents with: Allergic Reaction   Colleen Cantrell is a 58 y.o. female.    Allergic Reaction  Presents complaints of allergic reaction.  States she had eaten dinner and began to feel itchiness in her hands.  Progressed to redness on her arms face.  She states her chest felt tight.  No known food allergies.  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  That is improving now.   Past Medical History:  Diagnosis Date   Acne    Allergy to IVP dye    Axillary hidradenitis suppurativa    Endometriosis    Family history of colon cancer    Family history of heart disease    Fatigue    Fibroid    Hand pain    Hematuria    Hidradenitis    History of abnormal cervical Pap smear    History of uterine fibroid    Hypercholesterolemia    Hyperlipidemia    Hypertension    Hypertriglyceridemia    Iron deficiency anemia    Obese    PMB (postmenopausal bleeding)    Polyarthralgia    Prediabetes    Shift work sleep disorder    Swimmer's ear    Trochanteric bursitis     Prior to Admission medications   Medication Sig Start Date End Date Taking? Authorizing Provider  EPINEPHrine  0.3 mg/0.3 mL IJ SOAJ injection Inject 0.3 mg into the muscle as needed for anaphylaxis. 07/10/24  Yes Patsey Lot, MD  ibuprofen  (ADVIL ) 800 MG tablet Take 1 tablet (800 mg total) by mouth every 8 (eight) hours as needed. 07/14/20   Jertson, Jill Evelyn, MD  losartan  (COZAAR ) 100 MG tablet Take 100 mg by mouth daily.    [provider]  metFORMIN (GLUCOPHAGE-XR) 500 MG 24 hr tablet Take 1,000 mg by mouth 2 (two) times daily. 08/21/20   [provider]  Multiple Vitamins-Minerals (MULTIVITAMIN ADULT) TABS Take 1 tablet by mouth daily.    [provider]  rosuvastatin (CRESTOR) 10 MG tablet Take 10 mg by mouth every evening.     [provider]    Allergies: Amoxicillin-pot clavulanate, Covid-19 mrna vacc (moderna), Iodinated contrast media, Meperidine , Rifampin, Sulfamethoxazole-trimethoprim, and Sulfonamide derivatives    Review of Systems  Updated Vital Signs BP 116/66   Pulse 94   Temp 98.2 F (36.8 C)   Resp 18   LMP 03/30/2014   SpO2 98%   Physical Exam Vitals and nursing note reviewed.  HENT:     Mouth/Throat:     Pharynx: No posterior oropharyngeal erythema.  Cardiovascular:     Rate and Rhythm: Regular rhythm.  Pulmonary:     Breath sounds: No wheezing.  Abdominal:     Tenderness: There is no abdominal tenderness.  Musculoskeletal:     Cervical back: Neck supple.  Skin:    Capillary Refill: Capillary refill takes less than 2 seconds.     Comments: Mild erythema forearms and chest.     (all labs ordered are listed, but only abnormal results are displayed) Labs Reviewed  BASIC METABOLIC PANEL WITH GFR - Abnormal; Notable for the following components:      Result Value   CO2 21 (*)    Glucose, Bld 192 (*)  Anion gap 17 (*)    All other components within normal limits  CBC - Abnormal; Notable for the following components:   WBC 14.6 (*)    All other components within normal limits  TROPONIN T, HIGH SENSITIVITY  TROPONIN T, HIGH SENSITIVITY    EKG: EKG Interpretation Date/Time:  Tuesday July 10 2024 21:11:26 EDT Ventricular Rate:  93 PR Interval:  185 QRS Duration:  117 QT Interval:  362 QTC Calculation: 451 R Axis:   30  Text Interpretation: Sinus rhythm Incomplete left bundle branch block Anterior Q waves, possibly due to ILBBB No significant change since last tracing Confirmed by Patsey Lot [phone]) on 07/10/2024 9:22:33 PM  Radiology: DG Chest Portable 1 View Result Date: 07/10/2024 CLINICAL DATA:  Chest pain. Patient was eating at a restaurant and turned red  with difficulty breathing and chest pain. EXAM: PORTABLE CHEST 1 VIEW COMPARISON:  None Available. FINDINGS: Normal heart size and pulmonary vascularity. No focal airspace disease or consolidation in the lungs. No blunting of costophrenic angles. No pneumothorax. Mediastinal contours appear intact. No radiopaque foreign bodies are identified. IMPRESSION: No active disease. Electronically Signed   By: Elsie Gravely M.D.   On: 07/10/2024 21:31     Procedures   Medications Ordered in the ED  predniSONE  (DELTASONE ) tablet 40 mg (40 mg Oral Given 07/10/24 2051)                                    Medical Decision Making Amount and/or Complexity of Data Reviewed Labs: ordered. Radiology: ordered.  Risk Prescription drug management.   Patient potential allergic reaction.  After eating food developed redness and itchiness on her hands forearms chest difficulty breathing.  Got Benadryl  and fire department and feeling much better.  However did have chest pain.  Will get EKG and blood work to evaluate for cardiac ischemia.  However feeling much better.  Will continue to monitor and will give dose of steroids for the reaction.   Abdominal wash to the ER for around 2 hours.  Symptoms continue to improve.  Blood work reassuring.  Negative troponin.  EKG reassuring.  Doubt cardiac ischemia.  Will discharge home to follow-up with PCP.  EpiPen  prescription given.  Discussed patient can take a dose of Zyrtec tomorrow if itching continues.     Final diagnoses:  Allergic reaction, initial encounter    ED Discharge Orders          Ordered    EPINEPHrine  0.3 mg/0.3 mL IJ SOAJ injection  As needed        07/10/24 2207               Patsey Lot, MD 07/10/24 2211
